# Patient Record
Sex: Male | Born: 1935 | Race: Black or African American | Hispanic: No | Marital: Married | Smoking: Former smoker
Health system: Southern US, Community
[De-identification: ages and names within clinical notes are randomized; demographics above are authoritative.]

## PROBLEM LIST (undated history)

## (undated) DIAGNOSIS — Z860100 Personal history of colon polyps, unspecified: Secondary | ICD-10-CM

## (undated) DIAGNOSIS — E785 Hyperlipidemia, unspecified: Secondary | ICD-10-CM

## (undated) DIAGNOSIS — N529 Male erectile dysfunction, unspecified: Secondary | ICD-10-CM

## (undated) DIAGNOSIS — J189 Pneumonia, unspecified organism: Secondary | ICD-10-CM

## (undated) DIAGNOSIS — R079 Chest pain, unspecified: Secondary | ICD-10-CM

## (undated) DIAGNOSIS — N400 Enlarged prostate without lower urinary tract symptoms: Secondary | ICD-10-CM

## (undated) DIAGNOSIS — K719 Toxic liver disease, unspecified: Secondary | ICD-10-CM

## (undated) DIAGNOSIS — I82419 Acute embolism and thrombosis of unspecified femoral vein: Secondary | ICD-10-CM

## (undated) DIAGNOSIS — M1990 Unspecified osteoarthritis, unspecified site: Secondary | ICD-10-CM

## (undated) DIAGNOSIS — Z950 Presence of cardiac pacemaker: Secondary | ICD-10-CM

## (undated) DIAGNOSIS — I442 Atrioventricular block, complete: Secondary | ICD-10-CM

## (undated) DIAGNOSIS — I10 Essential (primary) hypertension: Secondary | ICD-10-CM

## (undated) HISTORY — DX: Presence of cardiac pacemaker: Z95.0

## (undated) HISTORY — DX: Benign prostatic hyperplasia without lower urinary tract symptoms: N40.0

## (undated) HISTORY — DX: Acute embolism and thrombosis of unspecified femoral vein: I82.419

## (undated) HISTORY — DX: Atrioventricular block, complete: I44.2

## (undated) HISTORY — DX: Toxic liver disease, unspecified: K71.9

## (undated) HISTORY — DX: Personal history of colonic polyps: Z86.010

## (undated) HISTORY — DX: Personal history of colon polyps, unspecified: Z86.0100

## (undated) HISTORY — DX: Essential (primary) hypertension: I10

## (undated) HISTORY — DX: Hyperlipidemia, unspecified: E78.5

## (undated) HISTORY — DX: Male erectile dysfunction, unspecified: N52.9

---

## 1999-09-30 ENCOUNTER — Inpatient Hospital Stay (HOSPITAL_COMMUNITY): Admission: EM | Admit: 1999-09-30 | Discharge: 1999-10-05 | Payer: Self-pay | Admitting: Internal Medicine

## 1999-09-30 ENCOUNTER — Encounter: Payer: Self-pay | Admitting: Emergency Medicine

## 1999-10-03 ENCOUNTER — Encounter: Payer: Self-pay | Admitting: Cardiology

## 1999-10-05 ENCOUNTER — Encounter: Payer: Self-pay | Admitting: Internal Medicine

## 1999-10-25 ENCOUNTER — Inpatient Hospital Stay (HOSPITAL_COMMUNITY): Admission: EM | Admit: 1999-10-25 | Discharge: 1999-10-27 | Payer: Self-pay | Admitting: Internal Medicine

## 1999-10-25 ENCOUNTER — Encounter: Payer: Self-pay | Admitting: Internal Medicine

## 2002-11-14 ENCOUNTER — Emergency Department (HOSPITAL_COMMUNITY): Admission: EM | Admit: 2002-11-14 | Discharge: 2002-11-14 | Payer: Self-pay | Admitting: Emergency Medicine

## 2004-01-27 ENCOUNTER — Ambulatory Visit: Payer: Self-pay | Admitting: Internal Medicine

## 2004-02-04 ENCOUNTER — Ambulatory Visit: Payer: Self-pay | Admitting: Internal Medicine

## 2004-03-01 ENCOUNTER — Ambulatory Visit: Payer: Self-pay

## 2004-04-06 ENCOUNTER — Ambulatory Visit: Payer: Self-pay

## 2004-05-15 ENCOUNTER — Ambulatory Visit: Payer: Self-pay | Admitting: Internal Medicine

## 2004-06-20 ENCOUNTER — Ambulatory Visit: Payer: Self-pay | Admitting: Internal Medicine

## 2004-07-24 ENCOUNTER — Ambulatory Visit: Payer: Self-pay | Admitting: Internal Medicine

## 2004-08-29 ENCOUNTER — Ambulatory Visit: Payer: Self-pay | Admitting: Internal Medicine

## 2004-09-29 ENCOUNTER — Emergency Department (HOSPITAL_COMMUNITY): Admission: EM | Admit: 2004-09-29 | Discharge: 2004-09-29 | Payer: Self-pay | Admitting: Emergency Medicine

## 2004-10-05 ENCOUNTER — Ambulatory Visit: Payer: Self-pay

## 2004-10-20 ENCOUNTER — Ambulatory Visit: Payer: Self-pay | Admitting: Internal Medicine

## 2004-10-24 ENCOUNTER — Ambulatory Visit: Payer: Self-pay | Admitting: Internal Medicine

## 2005-04-12 ENCOUNTER — Ambulatory Visit: Payer: Self-pay

## 2005-12-18 ENCOUNTER — Ambulatory Visit: Payer: Self-pay | Admitting: Internal Medicine

## 2005-12-26 ENCOUNTER — Ambulatory Visit: Payer: Self-pay | Admitting: Gastroenterology

## 2006-01-22 ENCOUNTER — Ambulatory Visit: Payer: Self-pay | Admitting: Gastroenterology

## 2006-01-22 ENCOUNTER — Encounter (INDEPENDENT_AMBULATORY_CARE_PROVIDER_SITE_OTHER): Payer: Self-pay | Admitting: Specialist

## 2006-11-26 ENCOUNTER — Ambulatory Visit: Payer: Self-pay | Admitting: Internal Medicine

## 2007-01-08 ENCOUNTER — Encounter: Payer: Self-pay | Admitting: Internal Medicine

## 2007-01-08 ENCOUNTER — Ambulatory Visit: Payer: Self-pay | Admitting: Internal Medicine

## 2007-01-08 DIAGNOSIS — N400 Enlarged prostate without lower urinary tract symptoms: Secondary | ICD-10-CM

## 2007-01-08 DIAGNOSIS — Z860100 Personal history of colon polyps, unspecified: Secondary | ICD-10-CM | POA: Insufficient documentation

## 2007-01-08 DIAGNOSIS — I10 Essential (primary) hypertension: Secondary | ICD-10-CM | POA: Insufficient documentation

## 2007-01-08 DIAGNOSIS — E785 Hyperlipidemia, unspecified: Secondary | ICD-10-CM | POA: Insufficient documentation

## 2007-01-08 DIAGNOSIS — Z8672 Personal history of thrombophlebitis: Secondary | ICD-10-CM

## 2007-01-08 DIAGNOSIS — F528 Other sexual dysfunction not due to a substance or known physiological condition: Secondary | ICD-10-CM

## 2007-01-15 LAB — CONVERTED CEMR LAB
ALT: 11 units/L (ref 0–53)
AST: 21 units/L (ref 0–37)
Albumin: 3.5 g/dL (ref 3.5–5.2)
Alkaline Phosphatase: 54 units/L (ref 39–117)
BUN: 14 mg/dL (ref 6–23)
Basophils Absolute: 0 10*3/uL (ref 0.0–0.1)
Basophils Relative: 0.3 % (ref 0.0–1.0)
Bilirubin Urine: NEGATIVE
Bilirubin, Direct: 0.1 mg/dL (ref 0.0–0.3)
CO2: 30 meq/L (ref 19–32)
Calcium: 9.1 mg/dL (ref 8.4–10.5)
Chloride: 103 meq/L (ref 96–112)
Cholesterol: 218 mg/dL (ref 0–200)
Creatinine, Ser: 1.1 mg/dL (ref 0.4–1.5)
Direct LDL: 156 mg/dL
Eosinophils Absolute: 0 10*3/uL (ref 0.0–0.6)
Eosinophils Relative: 0.5 % (ref 0.0–5.0)
GFR calc Af Amer: 83 mL/min
GFR calc non Af Amer: 69 mL/min
Glucose, Bld: 84 mg/dL (ref 70–99)
HCT: 39.9 % (ref 39.0–52.0)
HDL: 47.8 mg/dL (ref >39.0)
Hemoglobin, Urine: NEGATIVE
Hemoglobin: 13.7 g/dL (ref 13.0–17.0)
Ketones, ur: NEGATIVE mg/dL
Leukocytes, UA: NEGATIVE
Lymphocytes Relative: 24.6 % (ref 12.0–46.0)
MCHC: 34.3 g/dL (ref 30.0–36.0)
MCV: 87.7 fL (ref 78.0–100.0)
Monocytes Absolute: 0.6 10*3/uL (ref 0.2–0.7)
Monocytes Relative: 11.7 % — ABNORMAL HIGH (ref 3.0–11.0)
Neutro Abs: 3.2 10*3/uL (ref 1.4–7.7)
Neutrophils Relative %: 62.9 % (ref 43.0–77.0)
Nitrite: NEGATIVE
PSA: 1.37 ng/mL (ref 0.10–4.00)
Platelets: 188 10*3/uL (ref 150–400)
Potassium: 4 meq/L (ref 3.5–5.1)
RBC: 4.55 M/uL (ref 4.22–5.81)
RDW: 13.4 % (ref 11.5–14.6)
Sodium: 138 meq/L (ref 135–145)
Specific Gravity, Urine: 1.025 (ref 1.000–1.03)
TSH: 0.53 microintl units/mL (ref 0.35–5.50)
Total Bilirubin: 0.7 mg/dL (ref 0.3–1.2)
Total CHOL/HDL Ratio: 4.6
Total Protein, Urine: NEGATIVE mg/dL
Total Protein: 7.2 g/dL (ref 6.0–8.3)
Triglycerides: 94 mg/dL (ref 0–149)
Urine Glucose: NEGATIVE mg/dL
Urobilinogen, UA: 0.2 (ref 0.0–1.0)
VLDL: 19 mg/dL (ref 0–40)
WBC: 5.1 10*3/uL (ref 4.5–10.5)
pH: 5.5 (ref 5.0–8.0)

## 2007-02-18 ENCOUNTER — Telehealth: Payer: Self-pay | Admitting: Internal Medicine

## 2007-02-20 ENCOUNTER — Ambulatory Visit: Payer: Self-pay | Admitting: Internal Medicine

## 2007-02-21 LAB — CONVERTED CEMR LAB
ALT: 9 units/L (ref 0–53)
AST: 21 units/L (ref 0–37)
Albumin: 3.2 g/dL — ABNORMAL LOW (ref 3.5–5.2)
Alkaline Phosphatase: 52 units/L (ref 39–117)
Bilirubin, Direct: 0.1 mg/dL (ref 0.0–0.3)
Cholesterol: 218 mg/dL (ref 0–200)
Direct LDL: 163.6 mg/dL
HDL: 40.3 mg/dL (ref >39.0)
Total Bilirubin: 0.5 mg/dL (ref 0.3–1.2)
Total CHOL/HDL Ratio: 5.4
Total Protein: 7 g/dL (ref 6.0–8.3)
Triglycerides: 125 mg/dL (ref 0–149)
VLDL: 25 mg/dL (ref 0–40)

## 2007-02-26 ENCOUNTER — Ambulatory Visit: Payer: Self-pay | Admitting: Internal Medicine

## 2007-04-09 ENCOUNTER — Ambulatory Visit: Payer: Self-pay | Admitting: Internal Medicine

## 2007-07-15 ENCOUNTER — Ambulatory Visit: Payer: Self-pay | Admitting: Internal Medicine

## 2007-08-13 ENCOUNTER — Ambulatory Visit: Payer: Self-pay | Admitting: Internal Medicine

## 2007-09-25 ENCOUNTER — Ambulatory Visit: Payer: Self-pay | Admitting: Cardiovascular Disease

## 2007-10-14 ENCOUNTER — Ambulatory Visit: Payer: Self-pay | Admitting: Internal Medicine

## 2007-10-14 LAB — CONVERTED CEMR LAB
BUN: 12 mg/dL (ref 6–23)
Basophils Absolute: 0 10*3/uL (ref 0.0–0.1)
Basophils Relative: 0.5 % (ref 0.0–3.0)
CO2: 28 meq/L (ref 19–32)
Calcium: 8.9 mg/dL (ref 8.4–10.5)
Chloride: 101 meq/L (ref 96–112)
Creatinine, Ser: 1.1 mg/dL (ref 0.4–1.5)
Eosinophils Absolute: 0 10*3/uL (ref 0.0–0.7)
Eosinophils Relative: 0.4 % (ref 0.0–5.0)
GFR calc Af Amer: 83 mL/min
GFR calc non Af Amer: 68 mL/min
Glucose, Bld: 69 mg/dL — ABNORMAL LOW (ref 70–99)
HCT: 38.1 % — ABNORMAL LOW (ref 39.0–52.0)
Hemoglobin: 13.1 g/dL (ref 13.0–17.0)
INR: 1.2 — ABNORMAL HIGH (ref 0.8–1.0)
Lymphocytes Relative: 27.6 % (ref 12.0–46.0)
MCHC: 34.5 g/dL (ref 30.0–36.0)
MCV: 86 fL (ref 78.0–100.0)
Monocytes Absolute: 0.7 10*3/uL (ref 0.1–1.0)
Monocytes Relative: 13.5 % — ABNORMAL HIGH (ref 3.0–12.0)
Neutro Abs: 2.9 10*3/uL (ref 1.4–7.7)
Neutrophils Relative %: 58 % (ref 43.0–77.0)
Platelets: 197 10*3/uL (ref 150–400)
Potassium: 3.6 meq/L (ref 3.5–5.1)
Prothrombin Time: 13.6 s — ABNORMAL HIGH (ref 10.9–13.3)
RBC: 4.43 M/uL (ref 4.22–5.81)
RDW: 14.3 % (ref 11.5–14.6)
Sodium: 136 meq/L (ref 135–145)
WBC: 5 10*3/uL (ref 4.5–10.5)
aPTT: 27.4 s (ref 21.7–29.8)

## 2007-10-21 ENCOUNTER — Ambulatory Visit: Payer: Self-pay | Admitting: Internal Medicine

## 2007-10-21 ENCOUNTER — Ambulatory Visit (HOSPITAL_COMMUNITY): Admission: RE | Admit: 2007-10-21 | Discharge: 2007-10-21 | Payer: Self-pay | Admitting: Internal Medicine

## 2007-11-06 ENCOUNTER — Ambulatory Visit: Payer: Self-pay

## 2008-03-26 ENCOUNTER — Ambulatory Visit: Payer: Self-pay | Admitting: Internal Medicine

## 2008-04-06 ENCOUNTER — Ambulatory Visit: Payer: Self-pay | Admitting: Internal Medicine

## 2008-04-06 DIAGNOSIS — Z950 Presence of cardiac pacemaker: Secondary | ICD-10-CM

## 2008-04-06 HISTORY — PX: INSERT / REPLACE / REMOVE PACEMAKER: SUR710

## 2008-04-09 ENCOUNTER — Encounter: Payer: Self-pay | Admitting: Internal Medicine

## 2008-05-06 ENCOUNTER — Ambulatory Visit: Payer: Self-pay | Admitting: Internal Medicine

## 2008-05-06 LAB — CONVERTED CEMR LAB
AST: 23 units/L (ref 0–37)
Alkaline Phosphatase: 52 units/L (ref 39–117)
Bilirubin, Direct: 0.1 mg/dL (ref 0.0–0.3)
LDL Cholesterol: 109 mg/dL — ABNORMAL HIGH (ref 0–99)
Total CHOL/HDL Ratio: 3.3

## 2008-09-24 ENCOUNTER — Ambulatory Visit: Payer: Self-pay | Admitting: Internal Medicine

## 2008-11-09 ENCOUNTER — Ambulatory Visit: Payer: Self-pay | Admitting: Internal Medicine

## 2009-01-05 ENCOUNTER — Ambulatory Visit: Payer: Self-pay | Admitting: Internal Medicine

## 2009-02-17 ENCOUNTER — Telehealth (INDEPENDENT_AMBULATORY_CARE_PROVIDER_SITE_OTHER): Payer: Self-pay | Admitting: *Deleted

## 2009-02-17 ENCOUNTER — Encounter: Payer: Self-pay | Admitting: Internal Medicine

## 2009-03-01 ENCOUNTER — Ambulatory Visit: Payer: Self-pay | Admitting: Internal Medicine

## 2009-03-14 ENCOUNTER — Telehealth (INDEPENDENT_AMBULATORY_CARE_PROVIDER_SITE_OTHER): Payer: Self-pay | Admitting: *Deleted

## 2009-03-15 ENCOUNTER — Encounter: Payer: Self-pay | Admitting: Internal Medicine

## 2009-03-30 ENCOUNTER — Ambulatory Visit: Payer: Self-pay | Admitting: Internal Medicine

## 2009-03-30 LAB — CONVERTED CEMR LAB
Basophils Absolute: 0 10*3/uL (ref 0.0–0.1)
Bilirubin, Direct: 0.1 mg/dL (ref 0.0–0.3)
CO2: 28 meq/L (ref 19–32)
Calcium: 8.9 mg/dL (ref 8.4–10.5)
Cholesterol: 153 mg/dL (ref 0–200)
Creatinine, Ser: 1.1 mg/dL (ref 0.4–1.5)
Eosinophils Absolute: 0 10*3/uL (ref 0.0–0.7)
GFR calc non Af Amer: 82.52 mL/min (ref >60)
HCT: 38.5 % — ABNORMAL LOW (ref 39.0–52.0)
Hemoglobin, Urine: NEGATIVE
Hemoglobin: 12.6 g/dL — ABNORMAL LOW (ref 13.0–17.0)
LDL Cholesterol: 90 mg/dL (ref 0–99)
Lymphs Abs: 1.3 10*3/uL (ref 0.7–4.0)
MCHC: 32.6 g/dL (ref 30.0–36.0)
MCV: 90.7 fL (ref 78.0–100.0)
Monocytes Absolute: 0.7 10*3/uL (ref 0.1–1.0)
Neutro Abs: 2.9 10*3/uL (ref 1.4–7.7)
RDW: 13.6 % (ref 11.5–14.6)
Total Bilirubin: 0.8 mg/dL (ref 0.3–1.2)
Total Protein, Urine: NEGATIVE mg/dL
Total Protein: 7 g/dL (ref 6.0–8.3)
Triglycerides: 48 mg/dL (ref 0.0–149.0)
Urine Glucose: NEGATIVE mg/dL

## 2009-03-31 ENCOUNTER — Ambulatory Visit: Payer: Self-pay | Admitting: Internal Medicine

## 2009-04-06 ENCOUNTER — Telehealth: Payer: Self-pay | Admitting: Internal Medicine

## 2009-06-01 ENCOUNTER — Ambulatory Visit: Payer: Self-pay | Admitting: Internal Medicine

## 2009-06-20 ENCOUNTER — Encounter: Payer: Self-pay | Admitting: Internal Medicine

## 2009-09-07 ENCOUNTER — Ambulatory Visit: Payer: Self-pay | Admitting: Internal Medicine

## 2009-09-21 ENCOUNTER — Encounter: Payer: Self-pay | Admitting: Internal Medicine

## 2009-10-19 ENCOUNTER — Ambulatory Visit: Payer: Self-pay | Admitting: Internal Medicine

## 2009-10-19 DIAGNOSIS — D179 Benign lipomatous neoplasm, unspecified: Secondary | ICD-10-CM | POA: Insufficient documentation

## 2009-10-19 LAB — CONVERTED CEMR LAB
BUN: 13 mg/dL (ref 6–23)
Calcium: 8.9 mg/dL (ref 8.4–10.5)
Cholesterol: 164 mg/dL (ref 0–200)
Creatinine, Ser: 1 mg/dL (ref 0.4–1.5)
GFR calc non Af Amer: 94.15 mL/min (ref >60)
LDL Cholesterol: 99 mg/dL (ref 0–99)
Potassium: 4 meq/L (ref 3.5–5.1)
Total Bilirubin: 0.6 mg/dL (ref 0.3–1.2)
Triglycerides: 71 mg/dL (ref 0.0–149.0)
VLDL: 14.2 mg/dL (ref 0.0–40.0)

## 2009-12-13 ENCOUNTER — Ambulatory Visit: Payer: Self-pay | Admitting: Internal Medicine

## 2009-12-13 DIAGNOSIS — R5381 Other malaise: Secondary | ICD-10-CM | POA: Insufficient documentation

## 2009-12-13 DIAGNOSIS — R5383 Other fatigue: Secondary | ICD-10-CM

## 2010-01-06 ENCOUNTER — Encounter (INDEPENDENT_AMBULATORY_CARE_PROVIDER_SITE_OTHER): Payer: Self-pay | Admitting: *Deleted

## 2010-03-16 ENCOUNTER — Ambulatory Visit
Admission: RE | Admit: 2010-03-16 | Discharge: 2010-03-16 | Payer: Self-pay | Source: Home / Self Care | Attending: Internal Medicine | Admitting: Internal Medicine

## 2010-03-17 ENCOUNTER — Encounter: Payer: Self-pay | Admitting: Internal Medicine

## 2010-03-24 ENCOUNTER — Encounter: Payer: Self-pay | Admitting: Internal Medicine

## 2010-04-09 LAB — CONVERTED CEMR LAB
ALT: 10 units/L (ref 0–53)
AST: 25 units/L (ref 0–37)
Albumin: 3.7 g/dL (ref 3.5–5.2)
Alkaline Phosphatase: 53 units/L (ref 39–117)
BUN: 11 mg/dL (ref 6–23)
Basophils Absolute: 0 10*3/uL (ref 0.0–0.1)
Basophils Relative: 1 % (ref 0.0–3.0)
Bilirubin Urine: NEGATIVE
Bilirubin, Direct: 0.1 mg/dL (ref 0.0–0.3)
CO2: 30 meq/L (ref 19–32)
Calcium: 9.3 mg/dL (ref 8.4–10.5)
Chloride: 101 meq/L (ref 96–112)
Cholesterol: 235 mg/dL (ref 0–200)
Creatinine, Ser: 1.1 mg/dL (ref 0.4–1.5)
Direct LDL: 174.8 mg/dL
Eosinophils Absolute: 0 10*3/uL (ref 0.0–0.7)
Eosinophils Relative: 0.5 % (ref 0.0–5.0)
GFR calc Af Amer: 83 mL/min
GFR calc non Af Amer: 68 mL/min
Glucose, Bld: 75 mg/dL (ref 70–99)
HCT: 38.1 % — ABNORMAL LOW (ref 39.0–52.0)
HDL: 51.3 mg/dL (ref >39.0)
Hemoglobin, Urine: NEGATIVE
Hemoglobin: 13.7 g/dL (ref 13.0–17.0)
Ketones, ur: NEGATIVE mg/dL
Leukocytes, UA: NEGATIVE
Lymphocytes Relative: 25.2 % (ref 12.0–46.0)
MCHC: 35.9 g/dL (ref 30.0–36.0)
MCV: 84.6 fL (ref 78.0–100.0)
Monocytes Absolute: 0.6 10*3/uL (ref 0.1–1.0)
Monocytes Relative: 14.2 % — ABNORMAL HIGH (ref 3.0–12.0)
Neutro Abs: 2.6 10*3/uL (ref 1.4–7.7)
Neutrophils Relative %: 59.1 % (ref 43.0–77.0)
Nitrite: NEGATIVE
PSA: 1.69 ng/mL (ref 0.10–4.00)
Platelets: 167 10*3/uL (ref 150–400)
Potassium: 4.4 meq/L (ref 3.5–5.1)
RBC: 4.5 M/uL (ref 4.22–5.81)
RDW: 13.6 % (ref 11.5–14.6)
Sodium: 137 meq/L (ref 135–145)
Specific Gravity, Urine: <=1.005 (ref 1.000–1.03)
TSH: 0.78 microintl units/mL (ref 0.35–5.50)
Total Bilirubin: 0.9 mg/dL (ref 0.3–1.2)
Total CHOL/HDL Ratio: 4.6
Total Protein, Urine: NEGATIVE mg/dL
Total Protein: 7.7 g/dL (ref 6.0–8.3)
Triglycerides: 81 mg/dL (ref 0–149)
Urine Glucose: NEGATIVE mg/dL
Urobilinogen, UA: 0.2 (ref 0.0–1.0)
VLDL: 16 mg/dL (ref 0–40)
WBC: 4.3 10*3/uL — ABNORMAL LOW (ref 4.5–10.5)
pH: 6 (ref 5.0–8.0)

## 2010-04-11 NOTE — Assessment & Plan Note (Signed)
Summary: 6 mos f/u appt per pt/cd   Vital Signs:  Patient profile:   77 year old male Height:      70 inches Weight:      210.50 pounds BMI:     30.31 O2 Sat:      98 % on Room air Temp:     97.1 degrees F oral BP sitting:   124 / 68  (left arm) Cuff size:   large  Vitals Entered By: Zella Ball Ewing CMA Duncan Dull) (October 19, 2009 3:55 PM)  O2 Flow:  Room air  CC: 6 month ROV/RE   CC:  6 month ROV/RE.  History of Present Illness:  not taking the amlodipine - not clear why;   overall doing well, Pt denies CP, sob, doe, wheezing, orthopnea, pnd, worsening LE edema, palps, dizziness or syncope  Pt denies new neuro symptoms such as headache, facial or extremity weakness  No fever, wt loss, night sweats, loss of appetite or other constitutional symptoms  Quite active, no worsening memory problems, drove last year to [place] for [employer] reunion and back without difficulty.  Does have a lump to the right scapular area he is concerned about but does not hurt and has not change in size since last visit   Problems Prior to Update: 1)  Hepatotoxicity, Drug-induced, Risk of  (ICD-V58.69) 2)  Lipoma  (ICD-214.9) 3)  Atrial Fibrillation-paroxysma  (ICD-427.31) 4)  Pacemaker  (ICD-V45.Marland Kitchen01) 5)  Preventive Health Care  (ICD-V70.0) 6)  Deep Venous Thrombophlebitis, Hx of  (ICD-V12.52) 7)  Colonic Polyps, Hx of  (ICD-V12.72) 8)  Hyperlipidemia  (ICD-272.4) 9)  Atrioventricular Block,intermittent  (ICD-426.0) 10)  Erectile Dysfunction  (ICD-302.72) 11)  Benign Prostatic Hypertrophy  (ICD-600.00) 12)  Hypertension  (ICD-401.9)  Medications Prior to Update: 1)  Lisinopril-Hydrochlorothiazide 20-25 Mg Tabs (Lisinopril-Hydrochlorothiazide) .Marland Kitchen.. 1 By Mouth Once Daily 2)  Istalol 0.5 %  Soln (Timolol Maleate) .Marland Kitchen.. 1 Gtt Ou Qd 3)  Ecotrin Low Strength 81 Mg  Tbec (Aspirin) .Marland Kitchen.. 1 By Mouth Qd 4)  Simvastatin 40 Mg Tabs (Simvastatin) .Marland Kitchen.. 1po Once Daily 5)  Amlodipine Besylate 5 Mg Tabs (Amlodipine  Besylate) .Marland Kitchen.. 1po Once Daily  Current Medications (verified): 1)  Lisinopril-Hydrochlorothiazide 20-25 Mg Tabs (Lisinopril-Hydrochlorothiazide) .Marland Kitchen.. 1 By Mouth Once Daily 2)  Istalol 0.5 %  Soln (Timolol Maleate) .Marland Kitchen.. 1 Gtt Ou Qd 3)  Ecotrin Low Strength 81 Mg  Tbec (Aspirin) .Marland Kitchen.. 1 By Mouth Qd 4)  Simvastatin 40 Mg Tabs (Simvastatin) .Marland Kitchen.. 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   Head:  normocephalic and atraumatic.   Eyes:  vision grossly intact, pupils equal, and pupils round.   Ears:  R ear normal and L ear normal.   Nose:  no external deformity and no nasal discharge.   Mouth:  no gingival abnormalities and pharynx pink and moist.   Neck:  supple and no masses.   Lungs:  normal respiratory effort and normal breath sounds.   Heart:  normal rate and regular rhythm.   Extremities:  no edema, no erythema  Skin:  2 cm prob subq lipoma right upper back, nontender nonfluctuance, nontender   Impression & Recommendations:  Problem # 1:  HYPERTENSION (ICD-401.9)  The following medications were removed from the medication list:    Amlodipine Besylate 5 Mg Tabs (Amlodipine besylate) .Marland Kitchen... 1po once daily His updated medication list for this problem includes:     Lisinopril-hydrochlorothiazide 20-25 Mg Tabs (Lisinopril-hydrochlorothiazide) .Marland Kitchen... 1 by mouth once daily  Orders: TLB-BMP (Basic Metabolic Panel-BMET) (80048-METABOL)  BP today: 124/68 Prior BP: 120/60 (03/31/2009)  Labs Reviewed: K+: 4.0 (03/30/2009) Creat: : 1.1 (03/30/2009)   Chol: 153 (03/30/2009)   HDL: 53.70 (03/30/2009)   LDL: 90 (03/30/2009)   TG: 48.0 (03/30/2009) stable overall by hx and exam, ok to continue meds/tx as is   Problem # 2:  HYPERLIPIDEMIA (ICD-272.4)  His updated medication list for this problem includes:    Simvastatin 40 Mg Tabs (Simvastatin) .Marland Kitchen... 1po once daily  Orders: TLB-Lipid Panel (80061-LIPID)  Labs Reviewed: SGOT: 23 (03/30/2009)   SGPT: 8 (03/30/2009)   HDL:53.70 (03/30/2009), 50.4 (05/06/2008)  LDL:90 (03/30/2009), 109 (04/54/0981)  Chol:153 (03/30/2009), 168 (05/06/2008)  Trig:48.0 (03/30/2009), 42 (05/06/2008) stable overall by hx and exam, ok to continue meds/tx as is    Problem # 3:  LIPOMA (ICD-214.9) midl to mod size, right scapular area - d/w pt - no need tx at this time, to follow for any incr size  Complete Medication List: 1)  Lisinopril-hydrochlorothiazide 20-25 Mg Tabs (Lisinopril-hydrochlorothiazide) .Marland Kitchen.. 1 by mouth once daily 2)  Istalol 0.5 % Soln (Timolol maleate) .Marland Kitchen.. 1 gtt ou qd 3)  Ecotrin Low Strength 81 Mg Tbec (Aspirin) .Marland Kitchen.. 1 by mouth qd 4)  Simvastatin 40 Mg Tabs (Simvastatin) .Marland Kitchen.. 1po once daily  Other Orders: TLB-Hepatic/Liver Function Pnl (80076-HEPATIC)  Patient Instructions: 1)  Continue all previous medications as before this visit  2)  Please go to the Lab in the basement for your blood and/or urine tests today 3)  Please call the number on the [facility] for results of your testing 4)  Please schedule a follow-up appointment in 6 months with CPX labs

## 2010-04-11 NOTE — Letter (Signed)
Summary: Remote Device Check  [facility], Main Office  [address]   [place], [place] [address]   Phone: [phone]  Fax: [phone]     March 15, 2009 MRN: [id number]   [name] [address] [place], [place]  [address]   Dear [name],   Your remote transmission was recieved and reviewed by your physician.  All diagnostics were within normal limits for you.  __X___Your next transmission is scheduled for:   June 01, 2009.  Please transmit at any time this day.  If you have a wireless device your transmission will be sent automatically.      Sincerely,  Proofreader

## 2010-04-11 NOTE — Letter (Signed)
Summary: Remote Device Check  [facility], Main Office  [address]   [place], [place] [address]   Phone: [phone]  Fax: [phone]     September 21, 2009 MRN: [id number]   [name] [address] [place], [place]  [address]   Dear [name],   Your remote transmission was recieved and reviewed by your physician.  All diagnostics were within normal limits for you.  __X___Your next transmission is scheduled for:  12-08-2009.  Please transmit at any time this day.  If you have a wireless device your transmission will be sent automatically.    Sincerely,  Vella Kohler

## 2010-04-11 NOTE — Assessment & Plan Note (Signed)
Summary: pacer check.[email or web address]      Allergies Added: NKDA  Visit Type:  pacer check   History of Present Illness: [name] is seen in follow up her pacemaker implanted for high-grade heart block with subsequent generator replacement in August 2009. He is doing relatively well without complaints of chest pain. He does have some mild increases in his shortness of breath. He has had no palpitations.  He does complain of some fatigue. He has daytime somnolence and has on restful sleep.  Current Medications (verified): 1)  Lisinopril-Hydrochlorothiazide 20-25 Mg Tabs (Lisinopril-Hydrochlorothiazide) .Marland Kitchen.. 1 By Mouth Once Daily 2)  Istalol 0.5 %  Soln (Timolol Maleate) .Marland Kitchen.. 1 Gtt Ou Qd 3)  Ecotrin Low Strength 81 Mg  Tbec (Aspirin) .Marland Kitchen.. 1 By Mouth Qd 4)  Simvastatin 40 Mg Tabs (Simvastatin) .Marland Kitchen.. 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 HEENT Normal.  Neck veins were flat,  carotids were brisk.  Lungs were clear.  Heart sounds were regular without murmurs or gallops.  Abdomen was soft with active bowel sounds. There is no clubbing cyanosis or edema. Skin Warm and dry ]   PPM Specifications Following MD:  Sherryl Manges, MD     PPM Vendor:  Medtronic     PPM Model Number:  [id number]     PPM Serial Number:  [id number] PPM DOI:  10/21/2007     PPM Implanting MD:  Sherryl Manges, MD  Lead 1    Location: RA     DOI: 10/04/1999     Model #: 1342T     Serial #: [id number]     Status: active Lead 2    Location: RV     DOI: 10/04/1999     Model #: [id number]     Serial #: [id number]     Status: active  Magnet Response Rate:  BOL 85 ERI 65  Indications:  SYNCOPE   PPM Follow Up Battery Voltage:  2.79 V     Battery Est. Longevity:  10 yrs     Pacer Dependent:  No       PPM Device Measurements Atrium  Amplitude: 5.60 mV, Impedance: 466 ohms, Threshold: 0.50 V at 0.40 msec Right Ventricle  Amplitude: 8.00 mV, Impedance: 1064 ohms, Threshold: 0.50 V at 0.40 msec  Episodes MS Episodes:  80     Percent Mode Switch:  <0.1%     Coumadin:  No Ventricular High Rate:  0  Atrial Pacing:  11.3%     Ventricular Pacing:  16%  Parameters Mode:  DDDR     Lower Rate Limit:  60     Upper Rate Limit:  130 Paced AV Delay:  350     Sensed AV Delay:  350 Next Remote Date:  03/16/2010     Next Cardiology Appt Due:  12/11/2010 Tech Comments:  80 MODE SWITCHES--LONGEST WAS 1 HR 18 MINUTES. - COUMADIN.  NORMAL DEVICE FUNCTION. CHANGED RA OUTPUT FROM 1.50 TO .200 AND RV OUTPUT FROM 4.00 TO 2.50 V.  CARELINK TRANSMISSION 03-16-10 AND ROV IN 12 MTHS W/SK. Vella Kohler  December 13, 2009 3:24 PM  Impression & Recommendations:  Problem # 1:  ATRIOVENTRICULAR BLOCK,INTERMITTENT (ICD-426.0) stable post pacemaker implant His updated medication list for this problem includes:    Lisinopril-hydrochlorothiazide 20-25 Mg Tabs (Lisinopril-hydrochlorothiazide) .Marland Kitchen... 1 by mouth once daily    Ecotrin Low  Strength 81 Mg Tbec (Aspirin) .Marland Kitchen... 1 by mouth qd  Problem # 2:  PACEMAKER (ICD-V45.Marland Kitchen01) Device parameters and data were reviewed and no changes were made  Problem # 3:  FATIGUE (ICD-780.79) I wonder whether the patient doesn't have some obstructive sleep apnea. He certainly has the habitus and symptoms. I've asked her to discuss this with his wife. He is to followup with Dr. Jonny Ruiz in the next couple of months.

## 2010-04-11 NOTE — Assessment & Plan Note (Signed)
Summary: 6 MO ROV /NWS $50   Vital Signs:  Patient profile:   77 year old male Height:      66 inches Weight:      214 pounds BMI:     34.67 O2 Sat:      98 % on Room air Temp:     97.7 degrees F oral Pulse rate:   74 / minute BP sitting:   120 / 60  (left arm) Cuff size:   large  Vitals Entered By: Zella Ball Ewing (March 31, 2009 3:21 PM)  O2 Flow:  Room air  CC: 6 Mo ROV/RE   CC:  6 Mo ROV/RE.  History of Present Illness: overall doing well, no complaints except does not like the diuretic effect of the lis hct though he specifically denies BPH symptoms such as hesitancy, slow stream, or retention.  Pt denies CP, sob, doe, wheezing, orthopnea, pnd, worsening LE edema, palps, dizziness or syncope  Pt denies new neuro symptoms such as headache, facial or extremity weakness     Problems Prior to Update: 1)  Atrial Fibrillation-paroxysma  (ICD-427.31) 2)  Pacemaker  (ICD-V45.Marland Kitchen01) 3)  Preventive Health Care  (ICD-V70.0) 4)  Deep Venous Thrombophlebitis, Hx of  (ICD-V12.52) 5)  Colonic Polyps, Hx of  (ICD-V12.72) 6)  Hyperlipidemia  (ICD-272.4) 7)  Atrioventricular Block,intermittent  (ICD-426.0) 8)  Erectile Dysfunction  (ICD-302.72) 9)  Benign Prostatic Hypertrophy  (ICD-600.00) 10)  Hypertension  (ICD-401.9)  Medications Prior to Update: 1)  Lisinopril-Hydrochlorothiazide 20-25 Mg  Tabs (Lisinopril-Hydrochlorothiazide) .Marland Kitchen.. 1 By Mouth Once Daily 2)  Istalol 0.5 %  Soln (Timolol Maleate) .Marland Kitchen.. 1 Gtt Ou Qd 3)  Ecotrin Low Strength 81 Mg  Tbec (Aspirin) .Marland Kitchen.. 1 By Mouth Qd 4)  Simvastatin 40 Mg Tabs (Simvastatin) .Marland Kitchen.. 1po Once Daily  Current Medications (verified): 1)  Lisinopril 20 Mg Tabs (Lisinopril) .Marland Kitchen.. 1po Once Daily 2)  Istalol 0.5 %  Soln (Timolol Maleate) .Marland Kitchen.. 1 Gtt Ou Qd 3)  Ecotrin Low Strength 81 Mg  Tbec (Aspirin) .Marland Kitchen.. 1 By Mouth Qd 4)  Simvastatin 40 Mg Tabs (Simvastatin) .Marland Kitchen.. 1po Once Daily 5)  Amlodipine Besylate 5 Mg Tabs (Amlodipine Besylate) .Marland Kitchen.. 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         all otherwise negative per pt - 12 system review done   Physical Exam  General:  alert and well-developed.  Head:  normocephalic and atraumatic.   Eyes:  vision grossly intact, pupils equal, and pupils round.   Ears:  R ear normal and L ear normal.   Nose:  no external deformity and no nasal discharge.   Mouth:  no gingival abnormalities and pharynx pink and moist.   Neck:  supple and no masses.   Lungs:  normal respiratory effort and normal breath sounds.   Heart:   normal rate and regular rhythm.   Abdomen:  soft, non-tender, and normal bowel sounds.   Msk:  no joint tenderness and no joint swelling.   Extremities:  no edema, no erythema  Neurologic:  cranial nerves II-XII intact and strength normal in all extremities.     Impression & Recommendations:  Problem # 1:  Preventive Health Care (ICD-V70.0) Overall doing well, updated the age appropriate counseling and education;  routine health screening/prevention reviewed and done as appropriate unless declined, immunizations up to date or declined, diet counseling done if overweight, urged to quit smoking if smokes , most recent labs reviewed and current ordered if appropriate, ecg reviewed or declined; information regarding Medicare Prevention requirements given if appropriate   Problem # 2:  HYPERTENSION (ICD-401.9)  His updated medication list for this problem includes:    Lisinopril 20 Mg Tabs (Lisinopril) .Marland Kitchen... 1po once daily    Amlodipine Besylate 5 Mg Tabs (Amlodipine besylate) .Marland Kitchen... 1po once daily adjust meds above to avoid diuritec effect per pt request  Complete Medication List: 1)  Lisinopril 20 Mg Tabs (Lisinopril) .Marland Kitchen.. 1po once daily 2)  Istalol 0.5 % Soln (Timolol maleate) .Marland Kitchen.. 1 gtt ou qd 3)  Ecotrin Low Strength 81 Mg Tbec (Aspirin) .Marland Kitchen.. 1 by mouth qd 4)  Simvastatin 40 Mg Tabs (Simvastatin) .Marland Kitchen.. 1po once daily 5)  Amlodipine Besylate 5 Mg Tabs (Amlodipine besylate) .Marland Kitchen.. 1po once daily  Patient Instructions: 1)  stop the lisinopril HCT 2)  start the lisinopril 20 mg per day 3)  start the amlodipine 5 mg per day 4)  Continue all previous medications as before this visit  5)  Please schedule a follow-up appointment in 6 months. Prescriptions: SIMVASTATIN 40 MG TABS (SIMVASTATIN) 1po once daily  #90 x 3   Entered and Authorized by:   Corwin Levins MD   Signed by:   Corwin Levins MD on 03/31/2009   Method used:   Electronically to        [facility] (retail)       [address]       [place], [place]  [address]       Ph: [phone]       Fax: [phone]   RxID:   [id number] AMLODIPINE BESYLATE 5 MG TABS (AMLODIPINE BESYLATE) 1po once daily  #90 x 3   Entered and Authorized by:   Corwin Levins MD   Signed by:   Corwin Levins MD on 03/31/2009   Method used:   Electronically to        [facility] (retail)       [address]       [place], [place]  [address]       Ph: [phone]       Fax: [phone]   RxID:   [id number] LISINOPRIL 20 MG TABS (LISINOPRIL) 1po once daily  #90 x 3   Entered and Authorized by:  Corwin Levins MD   Signed by:   Corwin Levins MD on 03/31/2009   Method used:   Electronically to        [facility] (retail)       [address]       [place], [place]  [address]       Ph: [phone]       Fax: [phone]   RxID:   [id number]

## 2010-04-11 NOTE — Letter (Signed)
Summary: Remote Device Check  [facility], Main Office  [address]   [place], [place] [address]   Phone: [phone]  Fax: [phone]     September 21, 2009 MRN: [id number]   RIELLY CORLETT [address] [place], [place]  [address]   Dear Mr. SIDDIQI,   Your remote transmission was recieved and reviewed by your physician.  All diagnostics were within normal limits for you.  __X____Your next office visit is scheduled for:  September with Dr Graciela Husbands. Please call our office to schedule an appointment.    Sincerely,  Vella Kohler

## 2010-04-11 NOTE — Cardiovascular Report (Signed)
Summary: Office Visit Remote   Office Visit Remote   Imported By: Roderic Ovens 03/17/2009 12:40:53  _____________________________________________________________________  External Attachment:    Type:   Image     Comment:   External Document

## 2010-04-11 NOTE — Cardiovascular Report (Signed)
Summary: Office Visit Remote  Office Visit Remote   Imported By: Roderic Ovens 06/21/2009 12:39:20  _____________________________________________________________________  External Attachment:    Type:   Image     Comment:   External Document

## 2010-04-11 NOTE — Cardiovascular Report (Signed)
Summary: Office Visit Remote   Office Visit Remote   Imported By: Roderic Ovens 09/22/2009 16:41:56  _____________________________________________________________________  External Attachment:    Type:   Image     Comment:   External Document

## 2010-04-11 NOTE — Progress Notes (Signed)
Summary: Rx change request  Phone Note Call from Patient Call back at Home Phone [phone]   Caller: Patient Call For: Corwin Levins MD Reason for Call: Talk to Doctor Summary of Call: Patient came into the office. Dr. Jonny Ruiz recently put him on Lisinopril and Amlodipine, and when he went to the pharmacy, they told him about the side effects and he does not want to take these medications. He wants to take different medications. He wants to take the ones he was taking before. Initial call taken by: Irma Newness,  April 06, 2009 12:55 PM  Follow-up for Phone Call        pt is waiting in the lobby, I advised pt that this will take MD time and he is seeing pt. pt insists on waiting until Rxs are changed per Verlon Au. please advise Follow-up by: Margaret Pyle, CMA,  April 06, 2009 1:06 PM  Additional Follow-up for Phone Call Additional follow up Details #1::        ok to go back to lis-hct - done hardcopy to LIM side B - dahlia  Additional Follow-up by: Corwin Levins MD,  April 06, 2009 1:20 PM    Additional Follow-up for Phone Call Additional follow up Details #2::    rx given to [facility] to give to pt in Lobby Follow-up by: Margaret Pyle, CMA,  April 06, 2009 1:31 PM  New/Updated Medications: LISINOPRIL-HYDROCHLOROTHIAZIDE 20-25 MG TABS (LISINOPRIL-HYDROCHLOROTHIAZIDE) 1 by mouth once daily Prescriptions: LISINOPRIL-HYDROCHLOROTHIAZIDE 20-25 MG TABS (LISINOPRIL-HYDROCHLOROTHIAZIDE) 1 by mouth once daily  #90 x 3   Entered and Authorized by:   Corwin Levins MD   Signed by:   Corwin Levins MD on 04/06/2009   Method used:   Print then Give to Patient   RxID:   [id number]

## 2010-04-11 NOTE — Progress Notes (Signed)
Summary: [facility]  Phone Note Other Incoming   Summary of Call: Received paperwork from [facility]. Put on MD's desk. Initial call taken by: Josph Macho CMA,  March 14, 2009 11:39 AM     Appended Document: [facility] Faxed completed paperwork and sent a copy to scanning.

## 2010-04-11 NOTE — Letter (Signed)
Summary: Remote Device Check  [facility], Main Office  [address]   [place], [place] [address]   Phone: [phone]  Fax: [phone]     June 20, 2009 MRN: [id number]   [name] [address] [place], [place]  [address]   Dear [name],   Your remote transmission was recieved and reviewed by your physician.  All diagnostics were within normal limits for you.  __X___Your next transmission is scheduled for:  September 07, 2009.  Please transmit at any time this day.  If you have a wireless device your transmission will be sent automatically.     Sincerely,  Proofreader

## 2010-04-11 NOTE — Miscellaneous (Signed)
Summary: dx correction  Clinical Lists Changes  Problems: Changed problem from PACEMAKER (ICD-V45..01) to PACEMAKER, PERMANENT (ICD-V45.01)  changed the incorrect dx code to correct dx code 

## 2010-04-12 ENCOUNTER — Ambulatory Visit: Admit: 2010-04-12 | Payer: Self-pay | Admitting: Internal Medicine

## 2010-04-12 ENCOUNTER — Other Ambulatory Visit: Payer: Self-pay

## 2010-04-13 NOTE — Letter (Signed)
Summary: [facility]  [facility]   Imported By: Lennie Odor 04/05/2010 14:33:03  _____________________________________________________________________  External Attachment:    Type:   Image     Comment:   External Document

## 2010-04-19 ENCOUNTER — Ambulatory Visit (INDEPENDENT_AMBULATORY_CARE_PROVIDER_SITE_OTHER): Payer: Medicare Other | Admitting: Internal Medicine

## 2010-04-19 ENCOUNTER — Encounter: Payer: Self-pay | Admitting: Internal Medicine

## 2010-04-19 ENCOUNTER — Encounter (INDEPENDENT_AMBULATORY_CARE_PROVIDER_SITE_OTHER): Payer: Self-pay | Admitting: *Deleted

## 2010-04-19 ENCOUNTER — Other Ambulatory Visit: Payer: Self-pay | Admitting: Internal Medicine

## 2010-04-19 ENCOUNTER — Other Ambulatory Visit: Payer: Medicare Other

## 2010-04-19 DIAGNOSIS — E785 Hyperlipidemia, unspecified: Secondary | ICD-10-CM

## 2010-04-19 DIAGNOSIS — I10 Essential (primary) hypertension: Secondary | ICD-10-CM

## 2010-04-19 DIAGNOSIS — Z0000 Encounter for general adult medical examination without abnormal findings: Secondary | ICD-10-CM

## 2010-04-19 LAB — CBC WITH DIFFERENTIAL/PLATELET
Basophils Absolute: 0 10*3/uL (ref 0.0–0.1)
Basophils Relative: 0.4 % (ref 0.0–3.0)
Eosinophils Absolute: 0 10*3/uL (ref 0.0–0.7)
HCT: 37 % — ABNORMAL LOW (ref 39.0–52.0)
Hemoglobin: 12.7 g/dL — ABNORMAL LOW (ref 13.0–17.0)
Lymphs Abs: 1.1 10*3/uL (ref 0.7–4.0)
MCHC: 34.2 g/dL (ref 30.0–36.0)
Monocytes Relative: 12 % (ref 3.0–12.0)
Neutro Abs: 3 10*3/uL (ref 1.4–7.7)
RDW: 15 % — ABNORMAL HIGH (ref 11.5–14.6)

## 2010-04-19 LAB — HEPATIC FUNCTION PANEL
AST: 23 U/L (ref 0–37)
Albumin: 3.8 g/dL (ref 3.5–5.2)
Total Protein: 7.3 g/dL (ref 6.0–8.3)

## 2010-04-19 LAB — BASIC METABOLIC PANEL
CO2: 25 mEq/L (ref 19–32)
Glucose, Bld: 82 mg/dL (ref 70–99)
Potassium: 3.8 mEq/L (ref 3.5–5.1)
Sodium: 134 mEq/L — ABNORMAL LOW (ref 135–145)

## 2010-04-19 LAB — URINALYSIS
Ketones, ur: NEGATIVE
Leukocytes, UA: NEGATIVE
Specific Gravity, Urine: 1.01 (ref 1.000–1.030)
Urine Glucose: NEGATIVE
Urobilinogen, UA: 0.2 (ref 0.0–1.0)
pH: 5 (ref 5.0–8.0)

## 2010-04-19 LAB — LIPID PANEL
HDL: 58.4 mg/dL (ref >39.00)
Triglycerides: 62 mg/dL (ref 0.0–149.0)

## 2010-04-19 LAB — TSH: TSH: 0.62 u[IU]/mL (ref 0.35–5.50)

## 2010-04-23 ENCOUNTER — Encounter (INDEPENDENT_AMBULATORY_CARE_PROVIDER_SITE_OTHER): Payer: Self-pay | Admitting: *Deleted

## 2010-04-27 NOTE — Assessment & Plan Note (Signed)
Summary: 6 MTH FU --STC   Vital Signs:  Patient profile:   77 year old male Height:      70 inches Weight:      213.38 pounds BMI:     30.73 O2 Sat:      98 % on Room air Temp:     97.6 degrees F oral Pulse rate:   86 / minute BP sitting:   124 / 68  (left arm) Cuff size:   large  Vitals Entered By: Zella Ball Ewing CMA Duncan Dull) (April 19, 2010 3:17 PM)  O2 Flow:  Room air  Preventive Care Screening  Last Flu Shot:    Date:  12/10/2009    Results:  given   CC: 6 month followup/RE   CC:  6 month followup/RE.  History of Present Illness: here for wellness, overall doing ok;  Pt denies CP, worsening sob, doe, wheezing, orthopnea, pnd, worsening LE edema, palps, dizziness or syncope  Pt denies new neuro symptoms such as headache, facial or extremity weakness  Pt denies polydipsia, polyuria.  Overall good compliance with meds, trying to follow low chol diet, wt stable, little excercise however  Overall good compliance with meds, and good tolerability.  No fever, wt loss, night sweats, loss of appetite or other constitutional symptoms  Denies worsening depressive symptoms, suicidal ideation, or panic.   Pt states good ability with ADL's, low fall risk, home safety reviewed and adequate, no significant change in hearing or vision, trying to follow lower chol diet, and occasionally active only with regular excercise.  No other new complaints.    Preventive Screening-Counseling & Management      Drug Use:  no.    Problems Prior to Update: 1)  Fatigue  (ICD-780.79) 2)  Hepatotoxicity, Drug-induced, Risk of  (ICD-V58.69) 3)  Lipoma  (ICD-214.9) 4)  Pacemaker, Permanent  (ICD-V45.01) 5)  Preventive Health Care  (ICD-V70.0) 6)  Deep Venous Thrombophlebitis, Hx of  (ICD-V12.52) 7)  Colonic Polyps, Hx of  (ICD-V12.72) 8)  Hyperlipidemia  (ICD-272.4) 9)  Atrioventricular Block,intermittent  (ICD-426.0) 10)  Erectile Dysfunction  (ICD-302.72) 11)  Benign Prostatic Hypertrophy   (ICD-600.00) 12)  Hypertension  (ICD-401.9)  Medications Prior to Update: 1)  Lisinopril-Hydrochlorothiazide 20-25 Mg Tabs (Lisinopril-Hydrochlorothiazide) .Marland Kitchen.. 1 By Mouth Once Daily 2)  Istalol 0.5 %  Soln (Timolol Maleate) .Marland Kitchen.. 1 Gtt Ou Qd 3)  Ecotrin Low Strength 81 Mg  Tbec (Aspirin) .Marland Kitchen.. 1 By Mouth Qd 4)  Simvastatin 40 Mg Tabs (Simvastatin) .Marland Kitchen.. 1po Once Daily  Current Medications (verified): 1)  Lisinopril-Hydrochlorothiazide 20-25 Mg Tabs (Lisinopril-Hydrochlorothiazide) .Marland Kitchen.. 1 By Mouth Once Daily 2)  Istalol 0.5 %  Soln (Timolol Maleate) .Marland Kitchen.. 1 Gtt Ou Qd 3)  Ecotrin Low Strength 81 Mg  Tbec (Aspirin) .Marland Kitchen.. 1 By Mouth Qd 4)  Simvastatin 40 Mg Tabs (Simvastatin) .Marland Kitchen.. 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 for above Hyperlipidemia Colonic polyps, hx of - multiple large LUE DVT  Past Surgical History: Last updated: 04/06/2008 s/p pacemaker-Medtronic  Family History: Last updated: 03/26/2008 Family History of Arthritis uncle with MI in his 25's  Social History: Last updated: 04/19/2010 Former Smoker Alcohol use-no Married 4 children semiretired  -  Brewing technologist, works now part Scientific laboratory technician at [employer] use-no  Risk Factors: Smoking Status: quit (01/08/2007)  Social History: Former Smoker Alcohol  use-no Married 4 children semiretired  -  Brewing technologist, 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         all otherwise negative per pt -    Physical Exam  General:  alert and overweight-appearing.   Head:  normocephalic and atraumatic.   Eyes:  vision grossly intact, pupils equal, and pupils round.   Ears:  R ear normal and L ear normal.   Nose:  no external deformity and no nasal discharge.   Mouth:  no gingival abnormalities and pharynx pink and moist.   Neck:  supple and no masses.   Lungs:  normal respiratory effort and normal breath sounds.   Heart:  normal rate and regular rhythm.   Abdomen:  soft, non-tender, and normal bowel sounds.   Msk:  no joint tenderness and no joint swelling.   Extremities:  no edema, no erythema  Neurologic:  cranial nerves II-XII intact and strength normal in all extremities.   Skin:  2 cm prob subq lipoma right upper back, nontender nonfluctuance, nontender Psych:  not anxious appearing and not depressed appearing.     Impression & Recommendations:  Problem # 1:  Preventive Health Care (ICD-V70.0) Overall doing well, age appropriate education and counseling updated, referral for preventive services and immunizations addressed, dietary counseling and smoking status adressed , most recent labs reviewed I have personally reviewed and have noted 1.The patient's medical and social history 2.Their use of alcohol, tobacco or illicit drugs 3.Their current medications and supplements 4. Functional ability including ADL's, fall risk, home safety risk, hearing & visual impairment  5.Diet and physical activities 6.Evidence for depression or mood disorders The patients weight, height, BMI  have been recorded in the chart I have made referrals, counseling and provided education to the patient based review of the above  Orders: TLB-BMP (Basic Metabolic Panel-BMET) (80048-METABOL) TLB-CBC Platelet -  w/Differential (85025-CBCD) TLB-Hepatic/Liver Function Pnl (80076-HEPATIC) TLB-Lipid Panel (80061-LIPID) TLB-TSH (Thyroid Stimulating Hormone) (84443-TSH) TLB-Udip ONLY (81003-UDIP)  Problem # 2:  HYPERTENSION (ICD-401.9)  His updated medication list for this problem includes:    Lisinopril-hydrochlorothiazide 20-25 Mg Tabs (Lisinopril-hydrochlorothiazide) .Marland Kitchen... 1 by mouth once daily  BP today: 124/68 Prior BP: 155/79 (12/13/2009)  Labs Reviewed: K+: 4.0 (10/19/2009) Creat: : 1.0 (10/19/2009)   Chol: 164 (10/19/2009)   HDL: 50.40 (10/19/2009)   LDL: 99 (10/19/2009)   TG: 71.0 (10/19/2009) stable overall by hx and exam, ok to continue meds/tx as is   Complete Medication List: 1)  Lisinopril-hydrochlorothiazide 20-25 Mg Tabs (Lisinopril-hydrochlorothiazide) .Marland Kitchen.. 1 by mouth once daily 2)  Istalol 0.5 % Soln (Timolol maleate) .Marland Kitchen.. 1 gtt ou qd 3)  Ecotrin Low Strength 81 Mg Tbec (Aspirin) .Marland Kitchen.. 1 by mouth qd 4)  Simvastatin 40 Mg Tabs (Simvastatin) .Marland Kitchen.. 1po once daily  Patient Instructions: 1)  Continue all previous medications as before this visit 2)  Please go to the Lab in the basement for your blood and/or urine tests today 3)  Please call the number on the [facility] Card for results of your testing  4)  Please schedule a follow-up appointment in 1 year, or sooner if needed Prescriptions: SIMVASTATIN 40  MG TABS (SIMVASTATIN) 1po once daily  #90 x 3   Entered and Authorized by:   Corwin Levins MD   Signed by:   Corwin Levins MD on 04/19/2010   Method used:   Electronically to        [facility] (retail)       [address]       [place], [place]  [address]       Ph: [phone]       Fax: [phone]   RxID:   [id number] LISINOPRIL-HYDROCHLOROTHIAZIDE 20-25 MG TABS (LISINOPRIL-HYDROCHLOROTHIAZIDE) 1 by mouth once daily  #90 x 3   Entered and Authorized by:   Corwin Levins MD   Signed by:   Corwin Levins MD on 04/19/2010   Method used:    Electronically to        [facility] (retail)       [address]       [place], [place]  [address]       Ph: [phone]       Fax: [phone]   RxID:   [id number]    Orders Added: 1)  TLB-BMP (Basic Metabolic Panel-BMET) [80048-METABOL] 2)  TLB-CBC Platelet - w/Differential [85025-CBCD] 3)  TLB-Hepatic/Liver Function Pnl [80076-HEPATIC] 4)  TLB-Lipid Panel [80061-LIPID] 5)  TLB-TSH (Thyroid Stimulating Hormone) [84443-TSH] 6)  TLB-Udip ONLY [81003-UDIP] 7)  Est. Patient 65& > [60630]

## 2010-05-03 NOTE — Cardiovascular Report (Signed)
Summary: Office Visit Remote   Office Visit Remote   Imported By: Roderic Ovens 04/25/2010 15:20:04  _____________________________________________________________________  External Attachment:    Type:   Image     Comment:   External Document

## 2010-05-03 NOTE — Letter (Signed)
Summary: Remote Device Check  [facility], Main Office  [address]   [place], [place] [address]   Phone: [phone]  Fax: [phone]     April 23, 2010 MRN: [id number]   MELISSA PULIDO [address] [place], [place]  [address]   Dear Mr. SOOKDEO,   Your remote transmission was recieved and reviewed by your physician.  All diagnostics were within normal limits for you.  __X___Your next transmission is scheduled for:  06-15-2010.  Please transmit at any time this day.  If you have a wireless device your transmission will be sent automatically.  Sincerely,  Vella Kohler

## 2010-05-04 ENCOUNTER — Encounter: Payer: Self-pay | Admitting: Internal Medicine

## 2010-05-18 NOTE — Letter (Signed)
[facility]: [facility]   Imported By: Sherian Rein 05/09/2010 14:07:30  _____________________________________________________________________  External Attachment:    Type:   Image     Comment:   External Document

## 2010-06-15 ENCOUNTER — Ambulatory Visit (INDEPENDENT_AMBULATORY_CARE_PROVIDER_SITE_OTHER): Payer: Medicare Other | Admitting: *Deleted

## 2010-06-15 DIAGNOSIS — I442 Atrioventricular block, complete: Secondary | ICD-10-CM

## 2010-06-15 DIAGNOSIS — R0989 Other specified symptoms and signs involving the circulatory and respiratory systems: Secondary | ICD-10-CM

## 2010-06-15 DIAGNOSIS — Z950 Presence of cardiac pacemaker: Secondary | ICD-10-CM

## 2010-06-16 ENCOUNTER — Other Ambulatory Visit: Payer: Self-pay

## 2010-06-20 NOTE — Progress Notes (Signed)
Pacer remote check  

## 2010-07-02 ENCOUNTER — Encounter: Payer: Self-pay | Admitting: *Deleted

## 2010-07-25 NOTE — Discharge Summary (Signed)
NAME:  [name], [name] NO.:  [id number]   MEDICAL RECORD NO.:  [id number]          PATIENT TYPE:  OIB   LOCATION:  2899                         FACILITY:  [facility]   PHYSICIAN:  Duke Salvia, MD, FACCDATE OF BIRTH:  [birth year]   DATE OF ADMISSION:  10/21/2007  DATE OF DISCHARGE:  10/21/2007                               DISCHARGE SUMMARY   This patient has no known drug allergies.   FINAL DIAGNOSES:  1. History of pacemaker implantation for syncope and trifascicular      block in July 2001.  2. Pacemaker currently at elective replacement indicator.  3. Explant of Medtronic [id number] on October 21, 2007.  Implant Medtronic      Versa dual-chamber pacemaker on October 21, 2007.   SECONDARY DIAGNOSES:  1. Benign prostatic hypertrophy.  2. Hypertension with moderate uncontrolled systolic blood pressures      this admission were 189 with a smaller cuff and 170 systolic with a      larger cuff.  This correlates with a blood pressure of 170/87      during office visit on September 25, 2007.  At that time, the patient      declined any further blood pressure medication suggestion.  3. Left carotid bruit last ultrasound was in 2001.  No internal      carotid artery stenosis.  4. Myoview study in July 2001, ejection fraction of 58%.  No ischemia.   PROCEDURES:  On October 21, 2007, explantation of existing pacemaker with  generator change, implantation of Medtronic Versa dual-chamber  pacemaker.  The procedure was successful.  The patient had no  postprocedural complications.  He is asked to remove his bandage on the  morning after the procedure on Wednesday, August 12.  He is asked to  keep the incision dry for whole week until Tuesday, August 18.   BRIEF HISTORY:  [name] is an 77 year old male.  He has a Medtronic  Kappa pacemaker implanted for syncope.  It was implanted on October 04, 1999.   When asked if he has ever had recurrence of his syncopal episodes, which  caused pacemaker implantation, he says that after a very full meal he  has a tendency to pass out on the table.  Electrocardiogram at office  visit shows sinus rhythm, heart rate of 77, and QRS of 152 milliseconds.   The patient's device was interrogated on August 13, 2007.  It was found to  be at [facility].  The patient had office visit on July 16.  At that time, his  blood pressure was 170/87.  The patient does deny elevated blood  pressures because he is taking a blood pressure medication and he says  that his blood pressure has always elevated at doctor visits.  The  patient does not complain of chest pain.  He does not get winded with  exertion.  The patient says he can walk 100 yards if he goes slowly.  The risk and benefits of pacemaker generator change were outlined to the  patient.  Postprocedure incision care has also been discussed.  The  patient  will present electively on October 21, 2007, for this procedure  with Dr. Beverly Sessions COURSE:  The patient presents electively on October 21, 2007.  He had successful generator change with implantation of the Medtronic  Versa dual-chamber pacemaker.  As mentioned above, his blood pressure  has been elevated.  With a regular size cuff, it was 180 systolic, and  changed to a large cuff, it became 170 systolic.  The recommendation for  additional antihypertensive medication is made.  It is suggested he take  labetalol 100 mg twice daily to begin.   His other medications include:  1. Lisinopril/hydrochlorothiazide 20/25 one tab daily.  2. Enteric-coated aspirin 81 mg daily.  3. Istalol 0.5% ophthalmic solution one drop both eyes daily.   He follows up at [facility] [address] on  Thursday, August 27, at 8 o'clock for carotid duplex ultrasound.  The  patient has known left carotid bruit and his last ultrasound was in July  2001.  At 9:40 a.m. on August 27, he will report to the [facility] to  have his incision  check.  Once again additional blood pressure  medication would be lisinopril 100 mg twice daily.   IT IS RECOMMENDED THAT WE TAKE HIS BLOOD PRESSURE ON AUGUST 27, AT THE  PACER CLINIC.  AT THAT TIME, HE MAY BE TAKING AN ADDITIONAL MEDICATION,  LABETALOL 100 MG TWICE DAILY TO SUPPLEMENT HIS  LISINOPRIL/HYDROCHLOROTHIAZIDE TABLET.      Maple Mirza, [place]      Duke Salvia, MD, [facility]  Electronically Signed    GM/MEDQ  D:  10/21/2007  T:  10/22/2007  Job:  [id number]

## 2010-07-25 NOTE — Assessment & Plan Note (Signed)
[facility]                         ELECTROPHYSIOLOGY OFFICE NOTE   NAME:[name], Ansell                      MRN:          [id number]  DATE:11/26/2006                            DOB:          1925/07/12    [name] comes in following pacemaker implantation about 7 years ago  for syncope. He has had no recurrent syncope.   He has no complaints of chest pain or shortness of breath.   His only medication is lisinopril/HCTZ 20/25.   PHYSICAL EXAMINATION:  VITAL SIGNS:  His blood pressure today was 160/87  but he says this is always true in the office and at the drugstore it is  always in the 120 range. His pulse was 85.  LUNGS:  Clear.  HEART:  Heart sounds were regular.  NECK:  Neck veins were flat.  EXTREMITIES:  Without edema.   Interrogation of his Medtronic Kappa 700 pulse generator demonstrates a  P wave of 5.6 with impedance of 430, threshold of 0.5 at 0.4. The R wave  was 8 with impedance of 1037, threshold of 0.75 at 0.4, battery voltage  2.68 with an estimated longevity of 13 months.   IMPRESSION:  1. Syncope.  2. Trifascicular block complicated by high-grade AV block.  3. Status post pacemaker for the above.  4. Hypertension.   [name] is doing fine. I should note as I reviewed his chart that  he has an old carotid bruit which was not reassessed today. This is  something we probably want to look at in this 77 year old gentleman.   We will see him again in 1 year's time.     Duke Salvia, MD, [facility]  Electronically Signed    SCK/MedQ  DD: 11/26/2006  DT: 11/27/2006  Job #: [id number]

## 2010-07-25 NOTE — Assessment & Plan Note (Signed)
[facility]                         ELECTROPHYSIOLOGY OFFICE NOTE   NAME:WILLIAMSAsbury, Hair                      MRN:          [id number]  DATE:04/06/2008                            DOB:          1925-06-17    HISTORY OF PRESENT ILLNESS:  Mr. Becraft is seen in followup for  pacemaker implanted for high-grade heart block with subsequent generator  replacement in August of 2009.  He is doing well without complaints of  chest pain or shortness of breath.   MEDICATIONS:  His medications include  1. Lisinopril HCT 20/25.  2. Aspirin.  3. Simvastatin.   PHYSICAL EXAMINATION:  GENERAL:  His appearance belies his 77 years of  age.  VITAL SIGNS:  His blood pressure is 142/82 with a pulse of 89.  Weight  was 208, which is stable.  LUNGS:  Clear.  HEART:  Sounds were regular.  ABDOMEN:  Soft.  EXTREMITIES:  Without edema.   Interrogation of his Medtronic pulse generator demonstrates P-wave of  5.6, impedance of 441 with threshold 0.5 at 0.1.  The R-waves 8 with  impedance of 1104 and threshold 0.75 at 0.4.  Battery voltage is 2.79.   IMPRESSION:  1. 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  2. Syncope associated the high-grade heart block.  3. Status post pacer for the above.   PLAN:  Mr. Quirk is stable.  We will plan to see him again in 1  year's time.     Duke Salvia, MD, [facility]  Electronically Signed    SCK/MedQ  DD: 04/06/2008  DT: 04/07/2008  Job #: [id number]

## 2010-07-25 NOTE — Assessment & Plan Note (Signed)
[facility]                         ELECTROPHYSIOLOGY OFFICE NOTE   NAME:[name], [name]                      MRN:          [id number]  DATE:09/25/2007                            DOB:          [birth year]    ALLERGIES:  The patient has no known drug allergies.   BRIEF HISTORY:  This is an 77 year old male.  He has a Medtronic Kappa  pacemaker implanted for syncope.  It was implanted October 04, 1999.  This  gentlemen certainly does not look his stated age and still works in the  security.  When asked if he has never had recurrence of his syncope, he  says that if he eats a very full meal, he has a tendency to pass out at  the table.  I asked him again, if there is something that your wife has  observed, he said yes.  He states, he has never had this happening while  he is driving or when he is at work.  Electrocardiogram today shows the  patient to be in sinus rhythm with a heart rate of 77.  QRS of 152  milliseconds.  The patient with magnet on is V-pacing at 60.   PAST MEDICAL HISTORY:  The patient's past medical history also includes  hypertension and benign prostatic hypertrophy.   His blood pressure today is 170 systolic and the patient is on  lisinopril/hydrochlorothiazide 20/25 one tab daily.   The patient's device was interrogated on August 13, 2007, and was found to  be a elective replacement indicator.  He comes to the office today for  workup prior to having the generator changed.  I must add that the  patient had a Myoview study, July 2001, ejection fraction 58% and no  ischemia.  The patient has left carotid bruit with a carotid duplex  ultrasound in 2001, which showed that there was no internal carotid  artery stenosis, but I feel that there has not been anything at least  not on the chart to show serial surveillance.   The patient does not complain of chest pain.  He does not get  particularly winded.  He could walk, he says, 100 yards if  he goes slow.  If he has recurrent syncope as described above, a transient loss of  consciousness after eating a full and heavy meal.   PHYSICAL EXAMINATION:  VITAL SIGNS:  Today, his weight is 211, which is  slightly down from his office visit on Jul 15, 2007.  Blood pressure  170/87 and pulse is 76.  LUNGS:  His lungs are clear with auscultation.  He has a mild left  carotid bruit which persists and no right carotid bruits.  HEART:  Regular rate and rhythm without murmur.  ABDOMEN:  Soft mildly obese, nondistended, and nontender.  EXTREMITIES:  Lower extremities without edema, cyanosis, or clubbing.   MEDICATIONS:  The patient's  medications are as follows:  1. Lisinopril/hydrochlorothiazide 20/25 daily.  2. Istalol 5 mg eye drops daily.  3. Enteric-coated aspirin 81 mg daily.   I approached him about additional medication for his blood pressure.  He  says  that his blood pressure was always higher here in the doctor's  office.  He says, if measured at an outside Pharmacy, it is always lower  -- 120-130 systolic.  I recommended Norvasc 5 mg tablets, but he does  not seem very eager to start additional medication.  I said to him that  we will followup his blood pressure when he presents for a generator  change and at that time he may need augmentation for his hypertension.  The patient is set for pacemaker generator change on October 21, 2007, at  [facility].  Laboratory studies will be drawn on October 13, 2007, [facility] office.      Maple Mirza, PA  Electronically Signed      Duke Salvia, MD, [facility]  Electronically Signed   GM/MedQ  DD: 09/25/2007  DT: 09/26/2007  Job #: [id number]

## 2010-07-25 NOTE — Assessment & Plan Note (Signed)
[facility]                         ELECTROPHYSIOLOGY OFFICE NOTE   NAME:WILLIAMSLeman, Martinek                      MRN:          [id number]  DATE:07/15/2007                            DOB:          January 24, 1926    Mr. Kopka is seen in followup for pacemaker implanted about 8 years  ago for syncope in the setting of trifascicular block.  He is paced 100%  of the time but is not device dependent.  He is approaching ERI.  His  shortness of breath symptoms are stable.  He has had no episodes of  light headedness or syncope.  He does not have chest pain and he does  not have any shortness of breath.   MEDICATIONS:  His medications are quite sparse and include:  1. Lisinopril/HCT 20/25.  2. Aspirin 81.  3. Eye drops.   PHYSICAL EXAMINATION:  VITAL SIGNS:  On examination today his blood  pressure was modestly elevated at 151/88 with pulse of 96.  LUNGS:  Clear.  HEART:  Sounds were regular.  EXTREMITIES:  Were without edema.   Interrogation of his device demonstrated a P wave of 5.76 with impedance  of 438 and threshold of 0.5 and 0.4.  The R wave is 8 with impedance of  1118 and threshold of 0.75 and 0.52; battery voltage is 2.6.  Estimated  longevity is 1 month.   He will be checked again on August 13, 2007.  He is approaching ERI.  I  have asked him to keep close watch of his blood pressure and follow this  up with Dr. Jonny Ruiz.     Duke Salvia, MD, [facility]  Electronically Signed    SCK/MedQ  DD: 07/15/2007  DT: 07/15/2007  Job #: [id number]

## 2010-07-28 NOTE — Op Note (Signed)
[place]. [facility]  Patient:    [name], [name]                      MRN: [id number] Proc. Date: 10/04/99 Adm. Date:  [id number] Attending:  Nathen May CC:         Electrophysiological Lab             [place] attn Freddi Che, M.D. [facility]                           Operative Report  PREOPERATIVE DIAGNOSES:  Syncope with trifascicular block, normal left ventricular function, negative Cardiolite.  POSTOPERATIVE DIAGNOSES:  Syncope with trifascicular block, normal left ventricular function, negative Cardiolite.  PROCEDURE:  Contrast stenography and implantation of a dual chamber rate responsive pacemaker.  DESCRIPTION OF PROCEDURE:  Following the obtainment of informed consent, the patient was brought to the electrophysiology laboratory and placed on the fluoroscopic table in supine position.  After routine prepping and draping of the left upper chest, intravenous contrast was injected via the left antecubital vein to identify the course of the apex through the subclavian vein.  This having been accomplished, lidocaine was infiltrated in the prepectoral subclavicular region.  An incision was made extending down to the layer of the prepectoral fascia using electrocautery.  A pocket was formed using electrocautery.  Hemostasis was obtained.  Thereafter, attention was turned again, access to the exit of subclavian vein which was accomplished without difficulty without the aspiration of air or puncture of the artery.  Two guide wires were placed and retained, and a hemostatic suture was placed in a figure-of-eight fashion and allowed to hang loosely.  Thereafter, two 9 French ______ introducer sheaths were placed that allowed for the sequential passage of a pace setter passive fixation ventricular lead which was subsequently removed, and a pace setter passive atrial lead model 1342 52 cm in length, serial number [id number].   Under fluoroscopic guidance the initial ventricular lead was manipulated to the right ventricular apex where stable perimeters were identified.  It was secured to the prepectoral fascia.  Thereafter, the atrial lead was manipulated to the right atrial appendage where the bipolar P-wave was 6.7 millivolts with an impedance of 455 ohms, a pacing threshold of 0.5 milliseconds at 0.6 volts, and a current threshold of 1.4 ma.  With these acceptable perimeters recorded, this lead was also secured to the prepectoral fascia and a hemostatic suture was secured.  However, at this junction the hemostatic suture tore, and was removed.  I then placed a 0 silk tearaway sheath underneath the paired collars of the two leads, and I inadvertently punctured the collar and the ventricular lead at that juncture.  Measurements of the R-wave were 4.5 millivolts, and it was decided to abandon the lead. The lead was removed, and attempts were made to maintain access through a ______ puncture wire to the vein via the abandoned and soon to be extracted lead.  This, however, failed, as the retained guide wire failed to dislodge from the lead, and the defected lead removed with it, the wire that was supposed to be retained.  Thereafter, attention had to be returned again to regain access of the extra thoracic left subclavian vein which was accomplished now with modest difficulty.  The vein was cannulated on a number of cases, but the wire  could not be passed.  Ultimately, the wire did pass, and with puncture via the micro puncture system.  The sheath was hooked up to a 03 guide wire, and a 9 French sheath was passed, and then subsequently a Medtronic 5940 58 cm length lead, serial number [id number] was passed under fluoroscopic guidance to the right ventricular apex.  The bipolar R-wave was 8.0 millivolts with an impendance of 955 ohms, and a pacing threshold of 0.6 volts at 0.5 milliseconds.  There was no  diaphragmatic pacing at 10 volts with a current threshold of 0.8 ma.  With these acceptable parameters recorded, the pocket was irrigated copiously with antibiotic containing saline solution.  Hemostasis was obtained and ensured, and the leads of the pulse generator were then placed in the pocket, secured to the prepectoral fascia.  The wound was then closed in three layers in a normal fashion.  The wound was washed, dried, and benzoin and Steri-Strips dressing was then applied.  Sponge, needle, and instrument counts were correct at the end of the procedure according to the nurses. DD:  10/04/99 TD:  10/05/99 Job: [id number]

## 2010-07-28 NOTE — Discharge Summary (Signed)
[place]  Patient:    [name], [name]                      MRN: [id number] Adm. Date:  [id number] Disc. Date: 10/05/99 Attending:  Nathen May Dictator:   Tereso Newcomer, P.A. CC:         Corwin Levins, M.D. [facility]             Nathen May, M.D., [facility]                           Discharge Summary  DATE OF BIRTH:  07-16-1925  REASON FOR ADMISSION:  Syncope.  PROCEDURES PERFORMED THIS ADMISSION: 1. Status post permanent pacemaker insertion on October 04, 1999, with a    Medtronic device, manufacturer number [id number], lead model 1342P/52, serial    number [id number]. 2. Exercise Cardiolite on October 03, 1999.  The patient exercised 5 minutes    30 seconds under the Bruce protocol without chest pain.  His heart rate    was greater than 100% predicted maximal heart rate.  Cardiolite images    were without ischemia, EF 58%. 3. A 2-D echocardiogram on October 02, 1999, LV size upper limit of normal.    Normal contraction.  Trace to mild MR with mitral annular calcification.    Aortic sclerosis.  LV EF grossly normal.  DISCHARGE DIAGNOSES: 1. Status post permanent pacemaker. 2. Borderline hypertension. 3. History of benign prostatic hypertrophy. 4. 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 5. Left carotid bruit by exam.  ADMISSION HISTORY:  This patient was originally admitted by Dr. Kelle Darting of the internal medicine service for syncope.  Our service was consulted on October 01, 1999, and eventually took over the patients care.  He is a 77 year old African-American male with history of syncope in 1997.  The patient was admitted on July 21 after experiencing a syncopal episode.  He was at a high school reunion, sitting at a dinner table, when he suddenly felt dizzy and passed out shortly after this with loss of consciousness.  He was reportedly out for several  minutes.  No seizure activity was noted.  He did, however, have urinary incontinence.  There was no history of tongue bite.  He denied substernal chest pain or shortness of breath prior to his syncopal episode.  He denied any nausea or vomiting.  He denied any history of vasovagal syncope.  He denied any orthopnea or PND or limitations to his exercise tolerance.  CT scan performed of his head was negative on admission.  PHYSICAL EXAMINATION:  Initial physician exam by Dr. Andee Lineman revealed a well-nourished black male in no apparent distress.  Blood pressure 205/102, heart rate 62, respirations 16, O2 saturation 99%.  Neck without JVD. Positive left-sided carotid bruit; no bruit on the right.  Lungs clear.  Hear regular rate and rhythm, normal S1 and S2.  A soft systolic murmur was noted at the left upper sternal border.  No S3 or S4 noted.  Abdomen soft and nontender.  Extremities without cyanosis, clubbing, or edema.  LABORATORY DATA:  A 12-lead EKG revealed normal sinus rhythm with first degree A-V block, left anterior hemiblock, and right bundle branch block.  Left ventricular hypertrophy consistent with secondary QRS widening.  These findings were unchanged from prior EKG of 1997.  Initial laboratory  data revealed a white blood cell count of 5300, hemoglobin 13.3, hematocrit 40.3, platelet count 192,000.  Sodium 141, potassium 4.0, chloride 111, CO2 26, glucose 129, BUN 13, creatinine 1.0, total bilirubin 0.6, alkaline phosphatase 51, SGOT 22, SGPT 9, total protein 6.9, albumin 3.5, calcium 9.0.  Total CK 147, CK-MB 3.5, troponin I 0.09.  HOSPITAL COURSE:  It was felt that the patients syncope was probably related to his conduction system given his trifascicular block.  A 2-D echocardiogram was performed to rule out structural disease, and this was normal as noted above.  Also, the patients cardiac enzymes were as follows, total CK #2, 115; CK-MB 2.6.  Troponin I 0.12.  Given the  chronically elevated troponin I, it was felt the patient should have Cardiolite test performed to rule out ischemia.  An exercise Cardiolite was performed on October 03, 1999.  The patient exercised for a total of 5 minutes and 30 seconds on Bruce protocol without chest pain.  His maximum heart rate was 158 which was greater than 100% predicted maximal heart rate.  His blood pressure rose to 155/80.  Cardiolite images revealed no ischemia and EF of 58%.  The patient was seen by Dr. Graciela Husbands on October 04, 1999.  He felt that the patients syncope was likely resulting from trifascicular block.  He recommended the patient go for pacemaker insertion.  The patient had this procedure performed on October 04, 1999.  As noted above, he had a Medtronic pacemaker implanted.  On the morning of October 05, 1999, the pacer site looked good without hematoma.  It was decided the patient was stable enough for discharge and could be discharged to home without any medication.  The patients blood pressure was mostly less than 140/80 throughout admission, and it was felt that close followup as an outpatient would help decide whether or not the patient needed a antihypertensive.  DISCHARGE LABORATORY DATA:  Hemoglobin 14.3, hematocrit 43.8, white blood cell count 4600, platelet count 200,000.  INR 1.1.  Sodium 138, potassium 4.4, chloride 107, CO2 25, glucose 115, BUN 9, creatinine 1.0.  TSH 0.649. Hemoglobin A1C 5.8.  The patients chest x-ray on September 30, 1999, was without acute cardiopulmonary disease.  DISCHARGE MEDICATIONS:  As noted above, none.  ACTIVITY:  Limited left arm use until seen in the office.  WOUND CARE:  He is to keep his wound dry until Monday of next week, that is October 09, 1999.  FOLLOWUP:  With the physician assistant, Dian Queen, at [facility] on Tuesday, October 17, 1999, at 10 a.m.  The patient should also follow up with Dr. Jonny Ruiz on August 9 at 11:20 a.m. for blood pressure check and  decision for antihypertensive medication.  Also as an outpatient, the patient should be  followed up for his left carotid bruit noted on initial examination by Dr. Andee Lineman. DD:  10/05/99 TD:  10/05/99 Job: [id number]

## 2010-07-28 NOTE — Discharge Summary (Signed)
[facility]  Patient:    [name], [name]                      MRN: [id number] Adm. Date:  [id number] Disc. Date: [id number] Attending:  Patricia Nettle Dictator:   Tereso Newcomer, P.A. CC:         Shelby Dubin, [facility], c/o Lewayne Bunting, M.D.             Corwin Levins, M.D. [facility]                           Discharge Summary  DATE OF BIRTH:  08-09-1925.  REASON FOR ADMISSION:  Left upper extremity DVT.  Patient is status post permanent pacemaker implantation on October 04, 1999.  DISCHARGE DIAGNOSES 1. Left upper extremity deep venous thrombosis. 2. Status post permanent pacemaker placement, September 14, 1999, with Medtronic    device, manufacturer #[id number], lead model [id number], serial [id number]. 3. History of syncope with trifascicular block, reason for #2. 4. Exercise Cardiolite, October 03, 1999, negative for ischemia. 5. Two-dimensional echocardiogram, October 02, 1999:  Left ventricular size upper    limits of normal, normal contraction, trace-to-mild mitral regurgitation,    aortic sclerosis, normal ejection fraction. 6. History of benign prostatic hypertrophy. 7. Hypertension.  ADMISSION HISTORY:  [name] presented to the office on October 25, 1999 complaining of swelling in the left arm associated with redness and some pain. He denied any fevers or chills.  He denies any shortness of breath.  He had no orthopnea or PND.  Upper extremity Dopplers at the vascular lab revealed a thrombus in the distal subclavian, axillary and brachial veins, thrombosed down to the elbow.  PHYSICAL EXAMINATION:  Initial physical exam revealed blood pressure 150/78, pulse 80.  NECK:  Without JVD.  Positive left carotid bruit.  LUNGS:  Clear bilaterally.  HEART:  Regular rate and rhythm.  Normal S1 and S2.  No S3. ABDOMEN:  Soft, nontender.  No rebound or guarding.  EXTREMITIES:  Peripheral pulses 2+.  Marked edema in the left arm, all the way down to the wrist.  Good pulses are  palpable in the left arm.  There is a palpable cord in the left upper extremity.  There is no erythema or swelling in the other upper extremity.  HISTORY:  It was decided that patient needed to be admitted to the hospital for initiation of anticoagulation therapy.  He was begun on Lovenox and Coumadin.  His initial INR was 1.2; his INR at the time of discharge is 1.3. Patient received a total of two doses of Coumadin 5 mg prior to discharge.  He did well throughout his admission.  He denied any chest pain or shortness of breath.  The swelling in his arm improved slightly.  His blood pressure was noted to be somewhat elevated systolically throughout his admission.  Review of records from last admission revealed a borderline hypertension.  His blood pressures ranged from 166/91 to 153/80 throughout admission; therefore, low-dose Dyazide diuretic was started to treat his blood pressure.  On the morning of October 27, 1999, it was felt he was stable enough for discharge to home.  He had some difficulties in administering Lovenox to himself; therefore, it was decided that home health would be utilized to help give him his Lovenox injections until his INR becomes therapeutic.  Close followup over the weekend would be initiated.  The patient will have  his INR checked by home health on Saturday, October 28, 1999, and the results will be paged to our pharmacist in the [facility], Shelby Dubin.  The patient will also be seen in the office on Monday, October 30, 1999, by the physician assistant to check the patients left upper extremity and check his CBC and basic metabolic panel as well as to have his INR checked by the [facility].  Once his INR is therapeutic, the patient can have the Lovenox discontinued.  LABORATORY DATA:  Sodium 137, potassium 3.9, chloride 108, CO2 24, glucose 97, BUN 12, creatinine 1.1.  WBC 5000, hemoglobin 12.6, hematocrit 36.5, platelet count 193,000.  DISCHARGE  MEDICATIONS 1. Coumadin 5 mg q.d. 2. Lovenox 90 mg subcu q.12h. until INR is therapeutic. 3. Maxzide 25 mg half a tablet p.o. q.d.  ACTIVITY:  No heavy lifting or exertion with the left arm until swelling is better.  DIET:  Low-fat, low-sodium, low-cholesterol diet.  FOLLOWUP:  Followup is with the physician assistant on Monday, October 30, 1999, at 8:30 a.m.  Patient is to have INR checked with the [facility] on that date as well after his appointment.  He should have his INR rechecked by the home health agency on Saturday, October 28, 1999, and the results will be paged to [facility]. DD:  10/27/99 TD:  10/27/99 Job: [id number]

## 2010-07-28 NOTE — Assessment & Plan Note (Signed)
[facility]                           GASTROENTEROLOGY OFFICE NOTE   NAME:[name], [name]                      MRN:          [id number]  DATE:12/26/2005                            DOB:          11/05/1925   Patient referred for a colonoscopy examination. He is on Coumadin, denies  any other problems.  He is on Coumadin for his heart disease.  His last  colonoscopic examination was done in May of 2003 and revealed a number of  large polyps which were removed.  One was a tubular adenoma.   PAST MEDICAL HISTORY:  Essentially noncontributory.   He does not smoke or drink.   REVIEW OF SYSTEMS:  He did not put down anything.   FAMILY HISTORY:  He put down nothing of significance.   PHYSICAL EXAMINATION:  He was 5 feet 10 inches.  Weight 212.  Blood pressure  140/72.  Pulse 80 and regular.  He was moderately obese.  His oropharynx was negative.  His neck was negative.  Supraclavicular was negative.  CHEST:  Was clear.  ABDOMEN:  Was soft.  No masses or organomegaly.  EXTREMITIES:  Are unremarkable.  RECTAL:  Deferred.   IMPRESSION:  1. Previous history of multiple large colon polyps.  2. Hypertension.  3. Hypercholesterolemia.  4. History of syncope with __________ block requiring pacemaker.  5. History of BPH.   MEDICATION:  Maxzide and eye drops.   RECOMMENDATIONS:  That he be scheduled for a colonoscopy examination  sometime in the near future.            ______________________________  Ulyess Mort, MD   SML/MedQ DD:  12/26/2005 DT:  12/28/2005 Job #:  [id number]   cc:   Corwin Levins, MD

## 2010-07-28 NOTE — Assessment & Plan Note (Signed)
[facility]                           GASTROENTEROLOGY OFFICE NOTE   NAME:[name], [name]                      MRN:          [id number]  DATE:12/26/2005                            DOB:          1925-06-12    This very nice gentleman comes in on December 26, 2005 referred by Dr. Jonny Ruiz.  He is not on Coumadin, denies any problem.  He says he thinks he probably  needs a colonoscopy examination.  His last procedure was on Aug 05, 2001 and  he had multiple very large polyps, indicated at the time that he should have  a 6 month followup.            ______________________________  Ulyess Mort, MD      SML/MedQ  DD:  12/26/2005  DT:  12/28/2005  Job #:  [id number]

## 2010-07-28 NOTE — Consult Note (Signed)
[facility]  Patient:    DICKEY, CAAMANO                      MRN: [id number] Proc. Date: 10/01/99 Adm. Date:  [id number] Attending:  Evette Georges CC:         Evette Georges, M.D.             Corwin Levins, M.D.                          Consultation Report  REASON FOR CONSULTATION:  Syncope.  HISTORY OF PRESENT ILLNESS:  Mr. Galicia is a 77 year old African-American male with a past medical history of syncope in 1997.  The patient also has a history of hypertension and benign prostatic hypertrophy followed by Dr. Annabell Howells.  The patient was admitted last night after he had experienced a syncopal episode.  The patient was at a high school reunion and was sitting at the dinner table when suddenly he felt dizzy and shortly after this, passed out and lost consciousness.  The patient reportedly was out for several minutes.  This was according to bystanders.  Reportedly, there was no evidence of seizure activity.  The patient did report urinary incontinence.  There was no history of tongue bite.  The patient states he had one prior episode approximately three years ago when he was admitted to [facility]. At that time it was felt this was secondary to micturition syncope.  The patient did not report any substernal chest pain or shortness of breath prior to this syncopal episode.  He also reported no nausea or vomiting.  The patient has no prior history of vasovagal syncope.  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  The patient denies currently any orthopnea or PND.  He has no limitations in his exercise tolerance.  He states that he is currently not on any medical therapy, although reportedly had hypertension.  He denies any recent palpitations and there has been no recent presyncope.  The patient has not been  involved in trauma recently.  He did receive a CT scan in the emergency room on admission which was reportedly negative for intracranial pathology.  PAST MEDICAL HISTORY: 1. Borderline hypertension. 2. History of benign prostatic hypertrophy. 3. History of syncope in 1997 felt to be secondary to micturition syncope. 4. 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  ALLERGIES:  No known drug allergies.  FAMILY HISTORY:  Negative for coronary artery disease.  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  He does not smoke.  He does not drink alcohol.  There is no history of cocaine use.  REVIEW OF SYSTEMS:  No nausea or vomiting, no fever or chills.  No abdominal pain.  The remainder of review of systems is essentially within normal limits.  PHYSICAL EXAMINATION:  VITAL SIGNS:  Blood pressure 205/102, heart rate 62 beats per minute, respiratory rate is 16, saturation is 99%.  Temperature is 97.1.  GENERAL:  A well-nourished black male in no apparent distress.  NECK:  No JVD or abdominojugular reflex.  Left-sided carotid bruit, no bruit on the right side.  Normal carotid  upstroke bilaterally.  Neck is supple.  LUNGS:  Clear.  Breath sounds bilaterally, no wheezes.  HEART:  Regular rate and rhythm, normal S1, S2.  There is a soft systolic murmur at the left upper sternal border.  PMI is non-displaced.  There is no S3 or S4.  ABDOMEN:  Soft, nontender.  There is no rebound or guarding.  There are good bowel sounds.  EXTREMITIES:  2+ peripheral pulses.  There are no femoral bruits.  There is no abdominal bruit and there is no cyanosis, clubbing or edema.  NEUROLOGIC:  The patient is alert, oriented, grossly nonfocal.  LABORATORY DATA:  A 12-lead electrocardiogram shows normal sinus rhythm with first degree AV block, left anterior  hemiblock and right bundled branch block. Left ventricular hypertrophy consistent with secondary QRS widening.  These findings are unchanged from a prior electrocardiogram from 1997.  CK is 147, CKMB is 3.5.  CBC is essentially within normal limits with a hemoglobin of 13.3 and a white count of 5.3.  Glucose is 129, potassium is 4.0 creatinine is 1.0.  Liver function tests are within normal limits.  Troponin was mildly elevate do the initial set of 0.09, just above the upper limit of normal.  Blood gas was within normal limits with a PO2 of 94.4 and a PCO2 of 36.4, pH of 7.405.  Chest x-ray reportedly within normal limits, as well as a CT scan which the official reading is still pending but reportedly was within normal limits.  IMPRESSION AND PLAN: 1. Syncope.  The patients history could be consistent with neurologic cause    for syncope, particularly given the duration of his syncope and urinary    incontinence that was associated with it.  However, there was no    tonic-clonic seizure activity noted and I do suspect it is more likely the    patient had an underlying bradyarrhythmic event as a cause to his syncope.    The patient has significant conduction system disease with trifascicular    block by electrocardiogram.  This finding, however, is old and was    documented in 1997, at which time he also presented with another episode of    syncope.  I am concerned about the patients conduction system disease,    particularly as patients recurrent syncope and patients with trivesicular    was potentnially associated with an increased risk and sudden cardiac    death.  I recommend a further in-hospital work-up, including a 2D echo to    rule out structural heart disease as a cause for his conduction disease, as    well as ischemia evaluation with a stress Cardiolite study, particularly in    light of the elevated troponin and risk factors for coronary artery disease    and evidence of  peripheral vascular disease with a left carotid bruit.  If     these studies are negative, the patient should receive an EP evaluation and    I will consult with Dr. Ladona Ridgel and Dr. Graciela Husbands, whether this patient needs an    electrophysicological study, in particular, to measure his H-V interval.    If this is markedly prolonged in this current setting, this would be an    indication for permanent pacemaker placement.  I would also rule out the    patient further for myocardial infarction with serial troponins. 2. Hypertension.  The patient has marked hypertension.  It should be    controlled with medical therapy.  I will start an  ACE inhibitor.  Will need    to avoid AV nodal blocking agents. 3. Left carotid bruit.  I suspect this may have been worked up with carotid    Dopplers in the office and we will refer to Dr. Jonny Ruiz and Dr. Tawanna Cooler whether    this requires further evaluation at the present time.  DISPOSITION:  We will discuss with Dr. Graciela Husbands, Dr. Ladona Ridgel, further need for an invasive evaluation to rule out severe conduction system disease. DD:  10/01/99 TD:  10/02/99 Job: [id number]

## 2010-07-28 NOTE — H&P (Signed)
[facility]  Patient:    [name], [name]                      MRN: [id number] Adm. Date:  [id number] Attending:  Patricia Nettle CC:         Corwin Levins, M.D. [facility]             Nathen May, M.D., [facility]                         History and Physical  HISTORY OF PRESENT ILLNESS:  [name] is a 77 year old African-American male recently admitted status post syncope.  The patient was found to have trifascicular block complicated by high-grade AV block requiring permanent pacemaker implantation.  This procedure was performed approximately one week ago.  The patient noticed on Monday that he developed a swelling in the left arm associated with redness and some pain.  He denies any fevers or chills. He denies any shortness of breath.  He has no orthopnea or PND.  The patient was sent over to the vascular lab, where upper extremity Dopplers were performed, and he was found to have thrombus in the distal subclavian, axillary, and brachial veins, thrombosed down to the elbow.  PAST MEDICAL HISTORY: 1. Syncope with trifascicular block, status post permanent pacemaker placement    by Dr. Sherryl Manges on October 04, 1999, with a Medtronic device, manufacturer    number [id number], lead model 1342P-52, serial number [id number]. 2. Status post exercise Cardiolite October 03, 1999, negative for ischemia. 3. 2D echocardiogram October 02, 1999, LV size upper limits of normal, normal    contraction, trace to mild MR, aortic sclerosis, normal ejection fraction. 4. History of benign prostatic hypertrophy. 5. Left carotid bruit by exam.  ALLERGIES:  No known drug allergies.  FAMILY HISTORY:  Negative for coronary artery disease.  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  He does not smoke.  He does not drink alcohol.  There is no history of cocaine use.  MEDICATIONS:  No medications presently.  REVIEW OF  SYSTEMS:  No nausea or vomiting, no fevers or chills, no abdominal pain.  No orthopnea or PND.  PHYSICAL EXAMINATION:  VITAL SIGNS:  Blood pressure is 150/78, heart rate is 80 beats per minute.  NECK:  No JVD.  Left carotid bruit.  Neck is supple.  LUNGS:  Clear breath sounds bilaterally.  HEART:  Regular rate and rhythm, normal S1, S2.  No S3.  ABDOMEN:  Soft, nontender, no rebound or guarding.  EXTREMITIES:  Peripheral pulses 2+.  Marked edema in the left arm all the way down to the wrist.  Good pulses are palpable in the left arm.  There is a palpable cord in the left upper arm.  There is no erythema or swelling in the other upper extremity.  LABORATORY DATA:  Currently pending.  Twelve-lead electrocardiogram is pending.  Chest x-ray is currently pending.  IMPRESSION AND PLAN: 1. Status post pacemaker implantation complicated by upper arm deep vein    thrombosis.  The patient will be admitted to start antithrombotic therapy    with intravenous heparin versus Lovenox.  We will start the patient on    Lovenox 1 mg/kg subcutaneously q.12h.  In the a.m., the patient can be    started on Coumadin and then will likely require three to six months of  Coumadin therapy. 2. Status post pacemaker implantation.  Will check a chest x-ray to make sure    pacemaker is positioned well. 3. Hypertension.  The patient may require antihypertensive therapy prior to    discharge.  DISPOSITION:  If swelling improves, the patient can probably be sent home on home Lovenox and Coumadin. DD:  10/25/99 TD:  10/26/99 Job: [id number]

## 2010-07-28 NOTE — H&P (Signed)
[facility]  Patient:    [name], [name]                      MRN: [id number] Adm. Date:  [id number] Attending:  Evette Georges CC:         Corwin Levins, M.D. [facility]             Mark T. Nile Riggs, M.D.                         History and Physical  DATE OF BIRTH:  [birth year]  HISTORY OF PRESENT ILLNESS:  This is the second [facility] admission for this 77 year old semiretired black male who was admitted to the hospital for evaluation of syncope.  Patient states he was in his usual state of good health until sitting at a banquet this evening.  Before he had his meal, he got a sensation of dizziness.  He does not describe this as a spinning sensation, just a sensation of dizziness which he categorized more as "light-headedness."  He denies any chest pain, shortness of breath, or palpitations, or sensation of skipping or rapid heart rate.  Until the EMS arrived he does not remember anything else.  His wife relates that she looked at him and he said he wanted to go to his room because he felt bad.  He seemed to be staring after that and was noncommunicative.  He then got diaphoretic, but no other symptoms.  EMS came and put him on the floor, elevated his feet, and he "came to."  After that he has been perfectly normal and feels fine and indeed wants to go home. He had a spell like this in 1997, had a workup here, details of which are unknown.  Old records not available.  They did send me an EKG which is the same as his cardiogram tonight.  Other medical records again unavailable.  PAST MEDICAL HISTORY:  _________ in 1997, diagnosis unknown, same incident.  PAST ILLNESSES:  None.  INJURIES:  None.  DRUG ALLERGIES:  None.  CURRENT MEDICATIONS:  _________  one drop each eye b.i.d. for glaucoma via Dr. Nile Riggs.  HABITS:  He does not smoke or drink any alcohol.  REVIEW OF SYSTEMS:  Weight has been steady.  HEENT is negative except  for glaucoma.  CARDIOPULMONARY:  Negative.  GASTROINTESTINAL:  Negative. GENITOURINARY:  Negative.  MUSCULOSKELETAL:  Negative.  ENDOCRINE:  Negative.  FAMILY HISTORY:  His dad died at 8 of old age.  Mother died in 22s of lung cancer.  No brothers, no sisters.  SOCIAL HISTORY:  He is married, lives here in [place], [place].  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  PHYSICAL EXAMINATION:  VITAL SIGNS:  Temperature 99, pulse 70 and regular respirations 12 and regular, blood pressure 123/74.  GENERAL:  In general he is a well-developed, well-nourished, very muscular male in no acute distress.  HEENT:  Negative except for early bilateral cataracts.  NECK:  Supple, thyroid not enlarged.  No carotid bruits.  CHEST:  Clear to auscultation.  CARDIAC:  Negative.  ABDOMEN:  Negative.  GENITALIA/RECTAL:  Deferred.  He just had this checked and they were both normal, including a normal PSA.  EXTREMITIES:  Normal.  Skin normal.  Peripheral pulses normal.  NEUROLOGIC:  He is oriented x 3.  Cranial nerves 2-12 are intact and symmetrical.  Strength:  Muscle strength,  reflexes, sensation within normal limits.  Gait not tested.  LABORATORY DATA:  Shows a normal metabolic panel, CBC, arterial blood gases, and CT brain scan.  EKG shows normal sinus rhythm, occasional premature supraventricular beats. LAD, RBB, some left ventricular hypertrophy were [name] much unchanged from his previous cardiogram of 1997.  His CPK-MB total was 147 with an MB fraction of 3.5 and a troponin of 0.11.  IMPRESSION:  Syncopal episode in the upright position, question cardiac arrhythmia.  PLAN:  Admit, monitor, cardiac evaluation.  Dr. Gerri Spore was called and has kindly agreed to see him for consultation. DD:  09/30/99 TD:  10/02/99 Job: [id number]

## 2010-09-14 ENCOUNTER — Ambulatory Visit (INDEPENDENT_AMBULATORY_CARE_PROVIDER_SITE_OTHER): Payer: Medicare Other | Admitting: *Deleted

## 2010-09-14 ENCOUNTER — Other Ambulatory Visit: Payer: Self-pay | Admitting: Internal Medicine

## 2010-09-14 DIAGNOSIS — I442 Atrioventricular block, complete: Secondary | ICD-10-CM

## 2010-09-14 DIAGNOSIS — Z950 Presence of cardiac pacemaker: Secondary | ICD-10-CM

## 2010-09-16 LAB — REMOTE PACEMAKER DEVICE
AL AMPLITUDE: 2.8 mv
AL AMPLITUDE: 2.8 mv
AL IMPEDENCE PM: 435 Ohm
ATRIAL PACING PM: 17
BAMS-0001: 175 {beats}/min
BATTERY VOLTAGE: 2.79 V
BATTERY VOLTAGE: 2.79 V
BAVD-0001: 350 ms
BAVD-0002: 350 ms
BMOD-0005: 95 {beats}/min
BPAC-0002RA: 2 V
BPAC-0002RV: 3 V
BPAC-0003RA: 0.4 ms
BSEN-0002RA: 0.5 mv
BSEN-0005RA: 180 ms
BSEN-0005RV: 28 ms
DEV-0006LDO: 20010725
DEV-0006PM: 20090811
DEV-0023PM: 0
DEV-0025PM: 65
RV LEAD AMPLITUDE: 5.6 mv
RV LEAD IMPEDENCE PM: 955 Ohm
RV LEAD IMPEDENCE PM: 955 Ohm
RV LEAD THRESHOLD: 1.5 V
TCAP-0003RA: 0.4 ms
TCAP-0003RV: 0.4 ms
VENTRICULAR PACING PM: 24

## 2010-09-20 ENCOUNTER — Encounter: Payer: Self-pay | Admitting: *Deleted

## 2010-09-22 NOTE — Progress Notes (Signed)
Pacer remote check  

## 2011-01-23 ENCOUNTER — Ambulatory Visit (INDEPENDENT_AMBULATORY_CARE_PROVIDER_SITE_OTHER): Payer: Medicare Other

## 2011-01-23 DIAGNOSIS — Z23 Encounter for immunization: Secondary | ICD-10-CM

## 2011-02-20 ENCOUNTER — Encounter: Payer: Medicare Other | Admitting: Internal Medicine

## 2011-04-10 ENCOUNTER — Encounter: Payer: Self-pay | Admitting: Internal Medicine

## 2011-04-10 ENCOUNTER — Ambulatory Visit (INDEPENDENT_AMBULATORY_CARE_PROVIDER_SITE_OTHER): Payer: Medicare Other | Admitting: Internal Medicine

## 2011-04-10 DIAGNOSIS — I441 Atrioventricular block, second degree: Secondary | ICD-10-CM | POA: Insufficient documentation

## 2011-04-10 DIAGNOSIS — I442 Atrioventricular block, complete: Secondary | ICD-10-CM

## 2011-04-10 DIAGNOSIS — Z950 Presence of cardiac pacemaker: Secondary | ICD-10-CM

## 2011-04-10 LAB — PACEMAKER DEVICE OBSERVATION
AL AMPLITUDE: 4 mv
AL THRESHOLD: 0.75 V
ATRIAL PACING PM: 15
BAMS-0001: 175 {beats}/min
RV LEAD IMPEDENCE PM: 975 Ohm
RV LEAD THRESHOLD: 1 V
VENTRICULAR PACING PM: 25

## 2011-04-10 NOTE — Progress Notes (Signed)
  HPI  [name] is a 78 y.o. male seen in follow up her pacemaker implanted for high-grade heart block with subsequent generator replacement in August 2009. He is doing relatively well without complaints of chest pain. He does have some mild increases in his shortness of breath.  .    Past Medical History  Diagnosis Date  . Electromechanical dissociation (EMD) with heart block     intermittent  . Pacemaker     medtronic dual chamber  . Hepatotoxicity   . Lipoma   . DVT femoral (deep venous thrombosis) with thrombophlebitis     LUE  . Hyperlipidemia   . Hx of colonic polyps   . Erectile dysfunction   . BPH (benign prostatic hypertrophy)   . HTN (hypertension)     Past Surgical History  Procedure Date  . Pacemaker insertion 04/06/08    Medtronic    Current Outpatient Prescriptions  Medication Sig Dispense Refill  . aspirin 81 MG tablet Take 81 mg by mouth daily.      Marland Kitchen lisinopril-hydrochlorothiazide (PRINZIDE,ZESTORETIC) 20-25 MG per tablet Take 1 tablet by mouth daily.      . simvastatin (ZOCOR) 40 MG tablet Take 40 mg by mouth every evening.      . timolol (BETIMOL) 0.5 % ophthalmic solution Place 1 drop into both eyes daily.        No Known Allergies  Review of Systems negative except from HPI and PMH  Physical Exam BP 148/80  Pulse 73  Ht 5\' 10"  (1.778 m) Well developed and well nourished in no acute distress HENT normal E scleral and icterus clear Neck Supple JVP flat; carotids brisk and full Clear to ausculation regular rate and rhythm Regular rate and rhythm, no murmurs gallops or rub Soft with active bowel sounds No clubbing cyanosis none Edema Alert and oriented, grossly normal motor and sensory function Skin Warm and Dry   Assessment and  Plan

## 2011-04-10 NOTE — Patient Instructions (Signed)

## 2011-04-13 NOTE — Assessment & Plan Note (Signed)
The patient's device was interrogated.  The information was reviewed. No changes were made in the programming.    

## 2011-04-13 NOTE — Assessment & Plan Note (Signed)
stable °

## 2011-07-12 ENCOUNTER — Encounter: Payer: Medicare Other | Admitting: *Deleted

## 2011-07-17 ENCOUNTER — Encounter: Payer: Self-pay | Admitting: *Deleted

## 2011-07-24 ENCOUNTER — Encounter: Payer: Self-pay | Admitting: Internal Medicine

## 2011-07-24 ENCOUNTER — Ambulatory Visit (INDEPENDENT_AMBULATORY_CARE_PROVIDER_SITE_OTHER): Payer: Medicare Other | Admitting: *Deleted

## 2011-07-24 DIAGNOSIS — Z950 Presence of cardiac pacemaker: Secondary | ICD-10-CM

## 2011-07-24 DIAGNOSIS — I442 Atrioventricular block, complete: Secondary | ICD-10-CM

## 2011-07-30 LAB — REMOTE PACEMAKER DEVICE
AL AMPLITUDE: 2.8 mv
BAMS-0001: 175 {beats}/min
BATTERY VOLTAGE: 2.78 V
VENTRICULAR PACING PM: 29

## 2011-08-13 ENCOUNTER — Encounter: Payer: Self-pay | Admitting: *Deleted

## 2011-11-01 ENCOUNTER — Encounter: Payer: Self-pay | Admitting: Internal Medicine

## 2011-11-01 ENCOUNTER — Ambulatory Visit (INDEPENDENT_AMBULATORY_CARE_PROVIDER_SITE_OTHER): Payer: Medicare Other | Admitting: *Deleted

## 2011-11-01 DIAGNOSIS — Z950 Presence of cardiac pacemaker: Secondary | ICD-10-CM

## 2011-11-01 DIAGNOSIS — I442 Atrioventricular block, complete: Secondary | ICD-10-CM

## 2011-11-02 LAB — REMOTE PACEMAKER DEVICE
AL AMPLITUDE: 2.8 mv
AL THRESHOLD: 0.75 V
RV LEAD AMPLITUDE: 11.2 mv
RV LEAD IMPEDENCE PM: 857 Ohm
RV LEAD THRESHOLD: 1.375 V

## 2011-11-22 ENCOUNTER — Encounter: Payer: Self-pay | Admitting: *Deleted

## 2011-12-26 ENCOUNTER — Ambulatory Visit (INDEPENDENT_AMBULATORY_CARE_PROVIDER_SITE_OTHER): Payer: Medicare Other | Admitting: *Deleted

## 2011-12-26 DIAGNOSIS — Z23 Encounter for immunization: Secondary | ICD-10-CM

## 2012-02-04 ENCOUNTER — Encounter: Payer: Medicare Other | Admitting: *Deleted

## 2012-02-12 ENCOUNTER — Encounter: Payer: Self-pay | Admitting: *Deleted

## 2012-03-20 ENCOUNTER — Other Ambulatory Visit (INDEPENDENT_AMBULATORY_CARE_PROVIDER_SITE_OTHER): Payer: Medicare Other

## 2012-03-20 ENCOUNTER — Telehealth: Payer: Self-pay | Admitting: Internal Medicine

## 2012-03-20 ENCOUNTER — Encounter: Payer: Self-pay | Admitting: Internal Medicine

## 2012-03-20 ENCOUNTER — Ambulatory Visit (INDEPENDENT_AMBULATORY_CARE_PROVIDER_SITE_OTHER): Payer: Medicare Other | Admitting: Internal Medicine

## 2012-03-20 VITALS — BP 112/80 | HR 75 | Temp 97.2°F | Ht 70.0 in | Wt 218.5 lb

## 2012-03-20 DIAGNOSIS — M545 Low back pain, unspecified: Secondary | ICD-10-CM | POA: Insufficient documentation

## 2012-03-20 DIAGNOSIS — I442 Atrioventricular block, complete: Secondary | ICD-10-CM

## 2012-03-20 DIAGNOSIS — I10 Essential (primary) hypertension: Secondary | ICD-10-CM

## 2012-03-20 DIAGNOSIS — Z0000 Encounter for general adult medical examination without abnormal findings: Secondary | ICD-10-CM | POA: Insufficient documentation

## 2012-03-20 DIAGNOSIS — E785 Hyperlipidemia, unspecified: Secondary | ICD-10-CM

## 2012-03-20 LAB — CBC WITH DIFFERENTIAL/PLATELET
Basophils Relative: 0.7 % (ref 0.0–3.0)
Eosinophils Relative: 1.1 % (ref 0.0–5.0)
HCT: 37.8 % — ABNORMAL LOW (ref 39.0–52.0)
Hemoglobin: 12.6 g/dL — ABNORMAL LOW (ref 13.0–17.0)
Lymphs Abs: 1 10*3/uL (ref 0.7–4.0)
MCV: 87.4 fl (ref 78.0–100.0)
Monocytes Relative: 15.3 % — ABNORMAL HIGH (ref 3.0–12.0)
Neutro Abs: 3.1 10*3/uL (ref 1.4–7.7)
WBC: 5.1 10*3/uL (ref 4.5–10.5)

## 2012-03-20 LAB — BASIC METABOLIC PANEL
BUN: 13 mg/dL (ref 6–23)
Calcium: 9.1 mg/dL (ref 8.4–10.5)
GFR: 91.44 mL/min (ref >60.00)
Glucose, Bld: 84 mg/dL (ref 70–99)
Sodium: 134 mEq/L — ABNORMAL LOW (ref 135–145)

## 2012-03-20 LAB — URINALYSIS, ROUTINE W REFLEX MICROSCOPIC
Ketones, ur: NEGATIVE
Leukocytes, UA: NEGATIVE
Specific Gravity, Urine: <=1.005 (ref 1.000–1.030)
Total Protein, Urine: NEGATIVE
Urine Glucose: NEGATIVE
pH: 6 (ref 5.0–8.0)

## 2012-03-20 LAB — LIPID PANEL
HDL: 48.8 mg/dL (ref >39.00)
Triglycerides: 73 mg/dL (ref 0.0–149.0)
VLDL: 14.6 mg/dL (ref 0.0–40.0)

## 2012-03-20 LAB — HEPATIC FUNCTION PANEL
Albumin: 3.8 g/dL (ref 3.5–5.2)
Total Bilirubin: 0.8 mg/dL (ref 0.3–1.2)

## 2012-03-20 MED ORDER — SIMVASTATIN 40 MG PO TABS: 40.0000 mg | ORAL_TABLET | Freq: Every evening | ORAL | Status: DC

## 2012-03-20 MED ORDER — LISINOPRIL-HYDROCHLOROTHIAZIDE 20-25 MG PO TABS: 1.0000 | ORAL_TABLET | Freq: Every day | ORAL | Status: DC

## 2012-03-20 NOTE — Assessment & Plan Note (Signed)
Ok for back brace,likely underlying lumbar djd/ddd

## 2012-03-20 NOTE — Assessment & Plan Note (Signed)
stable overall by hx and exam, most recent data reviewed with pt, and pt to continue medical treatment as before BP Readings from Last 3 Encounters:  03/20/12 112/80  04/10/11 148/80  04/19/10 124/68

## 2012-03-20 NOTE — Assessment & Plan Note (Signed)
For lipids, restart statin

## 2012-03-20 NOTE — Assessment & Plan Note (Signed)
Also due for card f/u - will refer back to Dr Graciela Husbands

## 2012-03-20 NOTE — Patient Instructions (Addendum)
Your form was filled out for the back brace today Continue all other medications as before Please have the pharmacy call with any other refills you may need. Your EKG was OK today You are otherwise up to date with prevention measures today Please keep your appointments with your specialists as you have planned - Dr Graciela Husbands (the office will call) Please go to LAB in the Basement for the blood and/or urine tests to be done today You will be contacted by phone if any changes need to be made immediately.  Otherwise, you will receive a letter about your results with an explanation Please remember to sign up for My Chart at your earliest convenience, as this will be important to you in the future with finding out test results.

## 2012-03-20 NOTE — Assessment & Plan Note (Signed)

## 2012-03-20 NOTE — Progress Notes (Signed)
Subjective:    Patient ID: [name], male    DOB: Jan 13, 1926, 79 y.o.   MRN: [id number]  HPI  Here for wellness and f/u;  Overall doing ok;  Pt denies CP, worsening SOB, DOE, wheezing, orthopnea, PND, worsening LE edema, palpitations, dizziness or syncope.  Pt denies neurological change such as new Headache, facial or extremity weakness.  Pt denies polydipsia, polyuria, or low sugar symptoms. Pt states overall good compliance with treatment and medications, good tolerability, and trying to follow lower cholesterol diet.  Pt denies worsening depressive symptoms, suicidal ideation or panic. No fever, wt loss, night sweats, loss of appetite, or other constitutional symptoms.  Pt states good ability with ADL's, low fall risk, home safety reviewed and adequate, no significant changes in hearing or vision, and occasionally active with exercise   Nonsmoker, quit in 1968. Pt continues to have recurring LBP without change in severity, bowel or bladder change, fever, wt loss,  worsening LE pain/numbness/weakness, gait change or falls, does need back brace.  Due for card f/u.   Past Medical History  Diagnosis Date  . Atrioventricular block, complete     intermittent  . Pacemaker     medtronic dual chamber  . Hepatotoxicity   . Lipoma   . DVT femoral (deep venous thrombosis) with thrombophlebitis     LUE  . Hyperlipidemia   . Hx of colonic polyps   . Erectile dysfunction   . BPH (benign prostatic hypertrophy)   . HTN (hypertension)    Past Surgical History  Procedure Date  . Pacemaker insertion 04/06/08    Medtronic    reports that he has quit smoking. He does not have any smokeless tobacco history on file. He reports that he does not drink alcohol. His drug history not on file. family history includes Arthritis in an unspecified family member and Heart attack in an unspecified family member. No Known Allergies Current Outpatient Prescriptions on File Prior to Visit  Medication Sig Dispense  Refill  . aspirin 81 MG tablet Take 81 mg by mouth daily.      . timolol (BETIMOL) 0.5 % ophthalmic solution Place 1 drop into both eyes daily.      Marland Kitchen lisinopril-hydrochlorothiazide (PRINZIDE,ZESTORETIC) 20-25 MG per tablet Take 1 tablet by mouth daily.  90 tablet  3  . simvastatin (ZOCOR) 40 MG tablet Take 1 tablet (40 mg total) by mouth every evening.  90 tablet  3   Review of Systems Review of Systems  Constitutional: Negative for diaphoresis, activity change, appetite change and unexpected weight change.  HENT: Negative for hearing loss, ear pain, facial swelling, mouth sores and neck stiffness.   Eyes: Negative for pain, redness and visual disturbance.  Respiratory: Negative for shortness of breath and wheezing.   Cardiovascular: Negative for chest pain and palpitations.  Gastrointestinal: Negative for diarrhea, blood in stool, abdominal distention and rectal pain.  Genitourinary: Negative for hematuria, flank pain and decreased urine volume.  Musculoskeletal: Negative for myalgias and joint swelling.  Skin: Negative for color change and wound.  Neurological: Negative for syncope and numbness.  Hematological: Negative for adenopathy.  Psychiatric/Behavioral: Negative for hallucinations, self-injury, decreased concentration and agitation.      Objective:   Physical Exam BP 112/80  Pulse 75  Temp 97.2 F (36.2 C) (Oral)  Ht 5\' 10"  (1.778 m)  Wt 218 lb 8 oz (99.111 kg)  BMI 31.35 kg/m2  SpO2 98% Physical Exam  VS noted Constitutional: Pt is oriented to person, place, and time.  Appears well-developed and well-nourished./obese  HENT:  Head: Normocephalic and atraumatic.  Right Ear: External ear normal.  Left Ear: External ear normal.  Nose: Nose normal.  Mouth/Throat: Oropharynx is clear and moist.  Eyes: Conjunctivae and EOM are normal. Pupils are equal, round, and reactive to light.  Neck: Normal range of motion. Neck supple. No JVD present. No tracheal deviation present.    Cardiovascular: Normal rate, regular rhythm, normal heart sounds and intact distal pulses.   Pulmonary/Chest: Effort normal and breath sounds normal.  Abdominal: Soft. Bowel sounds are normal. There is no tenderness.  Musculoskeletal: Normal range of motion. Exhibits no edema.  Lymphadenopathy:  Has no cervical adenopathy.  Neurological: Pt is alert and oriented to person, place, and time. Pt has normal reflexes. No cranial nerve deficit.  Skin: Skin is warm and dry. No rash noted.  Psychiatric:  Has  normal mood and affect. Behavior is normal.     Assessment & Plan:

## 2012-03-20 NOTE — Telephone Encounter (Signed)
Pt seen at [facility] today  Appears not taking the statin according to elev LDL  Robin to contact pt;  New rx for BP med and Zocor done hardcopy as no pharmacy on record - pt needs to take

## 2012-03-21 ENCOUNTER — Telehealth: Payer: Self-pay | Admitting: Internal Medicine

## 2012-03-21 MED ORDER — ATORVASTATIN CALCIUM 40 MG PO TABS: 40.0000 mg | ORAL_TABLET | Freq: Every day | ORAL | Status: AC

## 2012-03-21 NOTE — Telephone Encounter (Signed)
Please advise as the patient is already taking simvastatin every day.

## 2012-03-21 NOTE — Telephone Encounter (Signed)
Pt states good compliacne with zocor but LDL still elevated  OK to change to Lipitor 40 mg for hopefully better control  Done hardcopy to robin  (no pharmacy on record)

## 2012-03-21 NOTE — Telephone Encounter (Signed)
Patient informed and faxed hardcopy to [facility] per request.

## 2012-04-11 ENCOUNTER — Encounter: Payer: Medicare Other | Admitting: Internal Medicine

## 2012-06-11 ENCOUNTER — Ambulatory Visit (INDEPENDENT_AMBULATORY_CARE_PROVIDER_SITE_OTHER): Payer: Medicare Other | Admitting: Internal Medicine

## 2012-06-11 ENCOUNTER — Encounter: Payer: Self-pay | Admitting: Internal Medicine

## 2012-06-11 VITALS — BP 139/71 | HR 84 | Ht 70.0 in | Wt 208.4 lb

## 2012-06-11 DIAGNOSIS — I4891 Unspecified atrial fibrillation: Secondary | ICD-10-CM

## 2012-06-11 DIAGNOSIS — I442 Atrioventricular block, complete: Secondary | ICD-10-CM

## 2012-06-11 LAB — PACEMAKER DEVICE OBSERVATION
AL IMPEDENCE PM: 435 Ohm
ATRIAL PACING PM: 13
BAMS-0001: 175 {beats}/min
BATTERY VOLTAGE: 2.78 V
VENTRICULAR PACING PM: 32

## 2012-06-11 NOTE — Progress Notes (Signed)
Patient Care Team: Corwin Levins, MD as PCP - General   HPI  [name] is a 79 y.o. male seen in follow up her pacemaker implanted for high-grade heart block with subsequent generator replacement in August 2009. He is doing relatively well without complaints of chest pain.   He denies sob and edema    Past Medical History  Diagnosis Date  . Atrioventricular block, complete     intermittent  . Pacemaker     medtronic dual chamber  . Hepatotoxicity   . Lipoma   . DVT femoral (deep venous thrombosis) with thrombophlebitis     LUE  . Hyperlipidemia   . Hx of colonic polyps   . Erectile dysfunction   . BPH (benign prostatic hypertrophy)   . HTN (hypertension)     Past Surgical History  Procedure Laterality Date  . Pacemaker insertion  04/06/08    Medtronic    Current Outpatient Prescriptions  Medication Sig Dispense Refill  . aspirin 81 MG tablet Take 81 mg by mouth daily.      Marland Kitchen atorvastatin (LIPITOR) 40 MG tablet Take 1 tablet (40 mg total) by mouth daily.  90 tablet  3  . lisinopril-hydrochlorothiazide (PRINZIDE,ZESTORETIC) 20-25 MG per tablet Take 1 tablet by mouth daily.  90 tablet  3  . timolol (BETIMOL) 0.5 % ophthalmic solution Place 1 drop into both eyes daily.       No current facility-administered medications for this visit.    No Known Allergies  Review of Systems negative except from HPI and PMH  Physical Exam BP 139/71  Pulse 84  Ht 5\' 10"  (1.778 m)  Wt 208 lb 6.4 oz (94.53 kg)  BMI 29.9 kg/m2  Well developed and nourished in no acute distress HENT normal Neck supple with JVP-flat Clear Regular rate and rhythm, no murmurs or gallops Abd-soft with active BS No Clubbing cyanosis edema Skin-warm and dry A & Oriented  Grossly normal sensory and motor function    Assessment and  Plan

## 2012-06-11 NOTE — Assessment & Plan Note (Signed)
Stable at 30% ventricular pacing

## 2012-06-11 NOTE — Assessment & Plan Note (Signed)
he had a five-minute episode of nonsustained atrial fibrillation. His CHADS-VASc score is 2; we will follow this carefully. In the event that he has a longer duration episode we will consider anticoagulation

## 2012-06-11 NOTE — Patient Instructions (Addendum)
Remote monitoring is used to monitor your Pacemaker of ICD from home. This monitoring reduces the number of office visits required to check your device to one time per year. It allows [place] to keep an eye on the functioning of your device to ensure it is working properly. You are scheduled for a device check from home on 09/16/12. You may send your transmission at any time that day. If you have a wireless device, the transmission will be sent automatically. After your physician reviews your transmission, you will receive a postcard with your next transmission date.  Your physician wants you to follow-up in: 1 year.   You will receive a reminder letter in the mail two months in advance. If you don't receive a letter, please call our office to schedule the follow-up appointment.

## 2012-09-15 ENCOUNTER — Ambulatory Visit (INDEPENDENT_AMBULATORY_CARE_PROVIDER_SITE_OTHER): Payer: Medicare Other | Admitting: *Deleted

## 2012-09-15 DIAGNOSIS — Z950 Presence of cardiac pacemaker: Secondary | ICD-10-CM

## 2012-09-15 DIAGNOSIS — I441 Atrioventricular block, second degree: Secondary | ICD-10-CM

## 2012-09-16 ENCOUNTER — Encounter: Payer: Self-pay | Admitting: Internal Medicine

## 2012-09-17 LAB — REMOTE PACEMAKER DEVICE
AL AMPLITUDE: 2.8 mv
BAMS-0001: 175 {beats}/min
BATTERY VOLTAGE: 2.77 V
RV LEAD AMPLITUDE: 8 mv
RV LEAD IMPEDENCE PM: 966 Ohm
RV LEAD THRESHOLD: 1.125 V

## 2012-09-29 ENCOUNTER — Observation Stay (HOSPITAL_COMMUNITY)
Admission: EM | Admit: 2012-09-29 | Discharge: 2012-10-01 | Disposition: A | Discharge status: Home / Self Care | Payer: Medicare Other | Attending: Cardiology | Admitting: Cardiology

## 2012-09-29 ENCOUNTER — Encounter (HOSPITAL_COMMUNITY): Payer: Self-pay | Admitting: Emergency Medicine

## 2012-09-29 ENCOUNTER — Emergency Department (HOSPITAL_COMMUNITY): Payer: Medicare Other

## 2012-09-29 DIAGNOSIS — R0609 Other forms of dyspnea: Secondary | ICD-10-CM | POA: Insufficient documentation

## 2012-09-29 DIAGNOSIS — R079 Chest pain, unspecified: Secondary | ICD-10-CM | POA: Insufficient documentation

## 2012-09-29 DIAGNOSIS — R011 Cardiac murmur, unspecified: Secondary | ICD-10-CM | POA: Insufficient documentation

## 2012-09-29 DIAGNOSIS — I200 Unstable angina: Secondary | ICD-10-CM

## 2012-09-29 DIAGNOSIS — N400 Enlarged prostate without lower urinary tract symptoms: Secondary | ICD-10-CM | POA: Diagnosis present

## 2012-09-29 DIAGNOSIS — D696 Thrombocytopenia, unspecified: Secondary | ICD-10-CM

## 2012-09-29 DIAGNOSIS — Z950 Presence of cardiac pacemaker: Secondary | ICD-10-CM | POA: Insufficient documentation

## 2012-09-29 DIAGNOSIS — R0989 Other specified symptoms and signs involving the circulatory and respiratory systems: Secondary | ICD-10-CM | POA: Insufficient documentation

## 2012-09-29 DIAGNOSIS — E785 Hyperlipidemia, unspecified: Secondary | ICD-10-CM | POA: Diagnosis present

## 2012-09-29 DIAGNOSIS — I10 Essential (primary) hypertension: Secondary | ICD-10-CM | POA: Insufficient documentation

## 2012-09-29 HISTORY — DX: Chest pain, unspecified: R07.9

## 2012-09-29 LAB — CBC WITH DIFFERENTIAL/PLATELET
Hemoglobin: 12.6 g/dL — ABNORMAL LOW (ref 13.0–17.0)
Lymphocytes Relative: 33 % (ref 12–46)
Lymphs Abs: 1.5 10*3/uL (ref 0.7–4.0)
MCV: 85.7 fL (ref 78.0–100.0)
Monocytes Relative: 15 % — ABNORMAL HIGH (ref 3–12)
Neutrophils Relative %: 50 % (ref 43–77)
Platelets: 94 10*3/uL — ABNORMAL LOW (ref 150–400)
RBC: 4.28 MIL/uL (ref 4.22–5.81)
WBC: 4.5 10*3/uL (ref 4.0–10.5)

## 2012-09-29 LAB — PROTIME-INR
INR: 0.99 (ref 0.00–1.49)
INR: 1.05 (ref 0.00–1.49)
Prothrombin Time: 12.9 seconds (ref 11.6–15.2)

## 2012-09-29 LAB — BASIC METABOLIC PANEL
CO2: 25 mEq/L (ref 19–32)
Glucose, Bld: 73 mg/dL (ref 70–99)
Potassium: 3.4 mEq/L — ABNORMAL LOW (ref 3.5–5.1)
Sodium: 134 mEq/L — ABNORMAL LOW (ref 135–145)

## 2012-09-29 LAB — POCT I-STAT TROPONIN I

## 2012-09-29 LAB — MAGNESIUM: Magnesium: 1.9 mg/dL (ref 1.5–2.5)

## 2012-09-29 LAB — APTT: aPTT: 30 seconds (ref 24–37)

## 2012-09-29 LAB — TROPONIN I: Troponin I: <0.3 ng/mL (ref <0.30)

## 2012-09-29 MED ORDER — ASPIRIN 81 MG PO TABS: 81.0000 mg | ORAL_TABLET | Freq: Every day | ORAL | Status: DC

## 2012-09-29 MED ORDER — ASPIRIN EC 81 MG PO TBEC
81.0000 mg | DELAYED_RELEASE_TABLET | Freq: Every day | ORAL | Status: DC
  Administered 2012-09-30 – 2012-10-01 (×2): 81 mg via ORAL
  Filled 2012-09-29 (×2): qty 1

## 2012-09-29 MED ORDER — ATORVASTATIN CALCIUM 40 MG PO TABS
40.0000 mg | ORAL_TABLET | Freq: Every day | ORAL | Status: DC
  Administered 2012-09-30 – 2012-10-01 (×2): 40 mg via ORAL
  Filled 2012-09-29 (×2): qty 1

## 2012-09-29 MED ORDER — HEPARIN (PORCINE) IN NACL 100-0.45 UNIT/ML-% IJ SOLN
800.0000 [IU]/h | INTRAMUSCULAR | Status: DC
  Administered 2012-09-29: 1100 [IU]/h via INTRAVENOUS
  Administered 2012-09-30: 850 [IU]/h via INTRAVENOUS
  Administered 2012-09-30: 1000 [IU]/h via INTRAVENOUS
  Filled 2012-09-29 (×4): qty 250

## 2012-09-29 MED ORDER — SODIUM CHLORIDE 0.9 % IV SOLN: 250.0000 mL | INTRAVENOUS | Status: DC | PRN

## 2012-09-29 MED ORDER — NITROGLYCERIN 0.4 MG SL SUBL: 0.4000 mg | SUBLINGUAL_TABLET | SUBLINGUAL | Status: DC | PRN

## 2012-09-29 MED ORDER — ONDANSETRON HCL 4 MG/2ML IJ SOLN: 4.0000 mg | Freq: Four times a day (QID) | INTRAMUSCULAR | Status: DC | PRN

## 2012-09-29 MED ORDER — SODIUM CHLORIDE 0.9 % IJ SOLN
3.0000 mL | Freq: Two times a day (BID) | INTRAMUSCULAR | Status: DC
  Administered 2012-10-01: 3 mL via INTRAVENOUS

## 2012-09-29 MED ORDER — TIMOLOL HEMIHYDRATE 0.5 % OP SOLN: 1.0000 [drp] | Freq: Every day | OPHTHALMIC | Status: DC

## 2012-09-29 MED ORDER — ASPIRIN 325 MG PO TABS
325.0000 mg | ORAL_TABLET | Freq: Once | ORAL | Status: AC
  Administered 2012-09-29: 325 mg via ORAL
  Filled 2012-09-29: qty 1

## 2012-09-29 MED ORDER — LISINOPRIL-HYDROCHLOROTHIAZIDE 20-25 MG PO TABS: 1.0000 | ORAL_TABLET | Freq: Every day | ORAL | Status: DC

## 2012-09-29 MED ORDER — NITROGLYCERIN 0.4 MG SL SUBL
0.4000 mg | SUBLINGUAL_TABLET | SUBLINGUAL | Status: DC | PRN
  Administered 2012-09-29: 0.4 mg via SUBLINGUAL
  Filled 2012-09-29: qty 25

## 2012-09-29 MED ORDER — LISINOPRIL 20 MG PO TABS
20.0000 mg | ORAL_TABLET | Freq: Every day | ORAL | Status: DC
  Administered 2012-09-30 – 2012-10-01 (×2): 20 mg via ORAL
  Filled 2012-09-29 (×2): qty 1

## 2012-09-29 MED ORDER — TIMOLOL MALEATE 0.5 % OP SOLN
1.0000 [drp] | Freq: Every day | OPHTHALMIC | Status: DC
  Administered 2012-09-30 – 2012-10-01 (×2): 1 [drp] via OPHTHALMIC
  Filled 2012-09-29: qty 5

## 2012-09-29 MED ORDER — HEPARIN BOLUS VIA INFUSION
4000.0000 [IU] | Freq: Once | INTRAVENOUS | Status: AC
  Administered 2012-09-29: 4000 [IU] via INTRAVENOUS

## 2012-09-29 MED ORDER — ACETAMINOPHEN 325 MG PO TABS: 650.0000 mg | ORAL_TABLET | ORAL | Status: DC | PRN

## 2012-09-29 MED ORDER — SODIUM CHLORIDE 0.9 % IJ SOLN: 3.0000 mL | INTRAMUSCULAR | Status: DC | PRN

## 2012-09-29 MED ORDER — HYDROCHLOROTHIAZIDE 25 MG PO TABS
25.0000 mg | ORAL_TABLET | Freq: Every day | ORAL | Status: DC
  Administered 2012-09-30 – 2012-10-01 (×2): 25 mg via ORAL
  Filled 2012-09-29 (×2): qty 1

## 2012-09-29 NOTE — ED Notes (Signed)
Attempted to call report to floor, nurse unable to take report at this time, call back info left with unit secretary

## 2012-09-29 NOTE — ED Provider Notes (Addendum)
History    CSN: [id number] Arrival date & time 09/29/12  1726  First MD Initiated Contact with Patient 09/29/12 1734     Chief Complaint  Patient presents with  . Chest Pain   (Consider location/radiation/quality/duration/timing/severity/associated sxs/prior Treatment) The history is provided by the patient.  [name] is a 79 y.o. male hx of complete heart block s/p pacemaker, HL, L upper extremity DVT not on coumadin here with chest pain. Substernal chest pain this AM, felt like "elephant sitting on his chest". He took ASA 81 mg and felt slightly better. Denies radiation from the pain. Some shortness of breath but no nausea or vomiting. No history of heart failure.   Past Medical History  Diagnosis Date  . Atrioventricular block, complete     intermittent  . Pacemaker     medtronic dual chamber  . Hepatotoxicity   . Lipoma   . DVT femoral (deep venous thrombosis) with thrombophlebitis     LUE  . Hyperlipidemia   . Hx of colonic polyps   . Erectile dysfunction   . BPH (benign prostatic hypertrophy)   . HTN (hypertension)    Past Surgical History  Procedure Laterality Date  . Pacemaker insertion  04/06/08    Medtronic   Family History  Problem Relation Age of Onset  . Arthritis    . Heart attack      uncle in his 22's   History  Substance Use Topics  . Smoking status: Former Games developer  . Smokeless tobacco: Not on file  . Alcohol Use: No    Review of Systems  Cardiovascular: Positive for chest pain.  All other systems reviewed and are negative.    Allergies  Review of patient's allergies indicates no known allergies.  Home Medications   Current Outpatient Rx  Name  Route  Sig  Dispense  Refill  . aspirin 81 MG tablet   Oral   Take 81 mg by mouth daily.         Marland Kitchen atorvastatin (LIPITOR) 40 MG tablet   Oral   Take 1 tablet (40 mg total) by mouth daily.   90 tablet   3   . lisinopril-hydrochlorothiazide (PRINZIDE,ZESTORETIC) 20-25 MG per  tablet   Oral   Take 1 tablet by mouth daily.   90 tablet   3   . timolol (BETIMOL) 0.5 % ophthalmic solution   Both Eyes   Place 1 drop into both eyes daily.          BP 158/88  Pulse 66  Temp(Src) 97.5 F (36.4 C) (Oral)  Resp 16  Ht 5\' 10"  (1.778 m)  Wt 206 lb (93.441 kg)  BMI 29.56 kg/m2  SpO2 100% Physical Exam  Nursing note and vitals reviewed. Constitutional: He is oriented to person, place, and time. He appears well-developed and well-nourished.  HENT:  Head: Normocephalic.  Mouth/Throat: Oropharynx is clear and moist.  Eyes: Conjunctivae are normal. Pupils are equal, round, and reactive to light.  Neck: Normal range of motion. Neck supple.  Cardiovascular: Normal rate, regular rhythm and normal heart sounds.   Pulmonary/Chest: Effort normal and breath sounds normal. No respiratory distress. He has no wheezes. He has no rales.  Not reproducible   Abdominal: Soft. Bowel sounds are normal. He exhibits no distension. There is no tenderness. There is no rebound.  Musculoskeletal: Normal range of motion.  Neurological: He is alert and oriented to person, place, and time.  Skin: Skin is warm and dry.  Psychiatric: He has  a normal mood and affect. His behavior is normal. Judgment and thought content normal.    ED Course  Procedures (including critical care time)  CRITICAL CARE Performed by: Silverio Lay, Mauro Arps   Total critical care time: 30 min   Critical care time was exclusive of separately billable procedures and treating other patients.  Critical care was necessary to treat or prevent imminent or life-threatening deterioration.  Critical care was time spent personally by me on the following activities: development of treatment plan with patient and/or surrogate as well as nursing, discussions with consultants, evaluation of patient's response to treatment, examination of patient, obtaining history from patient or surrogate, ordering and performing treatments and  interventions, ordering and review of laboratory studies, ordering and review of radiographic studies, pulse oximetry and re-evaluation of patient's condition.   Labs Reviewed  CBC WITH DIFFERENTIAL - Abnormal; Notable for the following:    Hemoglobin 12.6 (*)    HCT 36.7 (*)    Monocytes Relative 15 (*)    All other components within normal limits  BASIC METABOLIC PANEL - Abnormal; Notable for the following:    Sodium 134 (*)    Potassium 3.4 (*)    GFR calc non Af Amer 51 (*)    GFR calc Af Amer 59 (*)    All other components within normal limits  APTT  PROTIME-INR  POCT I-STAT TROPONIN I   Dg Chest 2 View  09/29/2012   *RADIOLOGY REPORT*  Clinical Data: Chest pain and tightness.  Hypertension.  Nonsmoker.  CHEST - 2 VIEW  Comparison: None.  Findings: There is elevation of the right hemidiaphragm.  Heart size is probably normal.  The patient has a left-sided transvenous pacemaker with leads to the right atrium and right ventricle. Lungs are clear.  IMPRESSION: No evidence for acute cardiopulmonary  abnormality. Elevated right hemidiaphragm.   Original Report Authenticated By: Norva Pavlov, M.D.   1. Unstable angina      Date: 09/29/2012  Rate: 54  Rhythm: normal sinus rhythm  QRS Axis: normal  Intervals: normal  ST/T Wave abnormalities: nonspecific ST changes  Conduction Disutrbances:right bundle branch block and left anterior fascicular block  Narrative Interpretation: + ventricular bigeminy  Old EKG Reviewed: unchanged    MDM  [name] is a 79 y.o. male here with chest pain. Concerning for unstable angina. Will get labs and cxr. Will likely need admission.   6:41 PM I called Dr. Myrtis Ser from [facility] cardiology. Trop neg x 1. Recommend heparin drip and transfer to [facility] for unstable angina. Pain free after asa and nitro.    Richardean Canal, MD 09/29/12 Paulo Fruit  Richardean Canal, MD 09/29/12 [phone]

## 2012-09-29 NOTE — ED Notes (Signed)
Pt here for c/o chest pain this am" like an elephant sitting on her chest " some sob denies n/v no sweating

## 2012-09-29 NOTE — H&P (Signed)
[name] is an 79 y.o. male.   Chief Complaint: chest pain Primary Cardiologist: Katz/EP Graciela Husbands HPI: [name] is an 50 yo man with PMH of HTN, dyslipidemia, high degree AV block s/p dual chamber PM 2009 followed by Dr. Graciela Husbands, prior LUE DVT who had substernal CP this morning that felt heavy and pressure-like, "like an elephant sitting on my chest" ultimately leading to presentation. He told me the pain lasted the better part of the day and it wasn't like something he has had before. He thought maybe it could be indigestion because he ate quite a bit the day before but he doesn't get indigestion very often either. He had no nausea/vomiting/diarrhea. No recent travel, no fever/chills. He does have some mild associated SOB/DOE. He can walk 1-2 blocks if he rests, no PND, no orthopnea (1 pillow). In the ER he received aspirin 324 mg PO x1, IV heparin 4000 units and 1100 units/hr drip initiated in ER at [facility] and transfer to [facility].   Past Medical History  Diagnosis Date  . Atrioventricular block, complete     intermittent  . Pacemaker     medtronic dual chamber  . Hepatotoxicity   . Lipoma   . DVT femoral (deep venous thrombosis) with thrombophlebitis     LUE  . Hyperlipidemia   . Hx of colonic polyps   . Erectile dysfunction   . BPH (benign prostatic hypertrophy)   . HTN (hypertension)     Past Surgical History  Procedure Laterality Date  . Pacemaker insertion  04/06/08    Medtronic    Family History  Problem Relation Age of Onset  . Arthritis    . Heart attack      uncle in his 69's   Social History:  reports that he has quit smoking. He does not have any smokeless tobacco history on file. He reports that he does not drink alcohol. His drug history is not on file. Quit tobacco 1968 Allergies: No Known Allergies  Medications Prior to Admission  Medication Sig Dispense Refill  . aspirin 81 MG tablet Take 81 mg by mouth daily.      Marland Kitchen atorvastatin  (LIPITOR) 40 MG tablet Take 1 tablet (40 mg total) by mouth daily.  90 tablet  3  . lisinopril-hydrochlorothiazide (PRINZIDE,ZESTORETIC) 20-25 MG per tablet Take 1 tablet by mouth daily.  90 tablet  3  . timolol (BETIMOL) 0.5 % ophthalmic solution Place 1 drop into both eyes daily.        Results for orders placed during the hospital encounter of 09/29/12 (from the past 48 hour(s))  CBC WITH DIFFERENTIAL     Status: Abnormal   Collection Time    09/29/12  6:00 PM      Result Value Range   WBC 4.5  4.0 - 10.5 K/uL   RBC 4.28  4.22 - 5.81 MIL/uL   Hemoglobin 12.6 (*) 13.0 - 17.0 g/dL   HCT 32.4 (*) 40.1 - 02.7 %   MCV 85.7  78.0 - 100.0 fL   MCH 29.4  26.0 - 34.0 pg   MCHC 34.3  30.0 - 36.0 g/dL   RDW 25.3  66.4 - 40.3 %   Platelets 94 (*) 150 - 400 K/uL   Comment: REPEATED TO VERIFY     SPECIMEN CHECKED FOR CLOTS     PLATELET COUNT CONFIRMED BY SMEAR   Neutrophils Relative % 50  43 - 77 %   Neutro Abs 2.3  1.7 - 7.7 K/uL  Lymphocytes Relative 33  12 - 46 %   Lymphs Abs 1.5  0.7 - 4.0 K/uL   Monocytes Relative 15 (*) 3 - 12 %   Monocytes Absolute 0.7  0.1 - 1.0 K/uL   Eosinophils Relative 2  0 - 5 %   Eosinophils Absolute 0.1  0.0 - 0.7 K/uL   Basophils Relative 0  0 - 1 %   Basophils Absolute 0.0  0.0 - 0.1 K/uL  BASIC METABOLIC PANEL     Status: Abnormal   Collection Time    09/29/12  6:00 PM      Result Value Range   Sodium 134 (*) 135 - 145 mEq/L   Potassium 3.4 (*) 3.5 - 5.1 mEq/L   Chloride 99  96 - 112 mEq/L   CO2 25  19 - 32 mEq/L   Glucose, Bld 73  70 - 99 mg/dL   BUN 20  6 - 23 mg/dL   Creatinine, Ser 8.11  0.50 - 1.35 mg/dL   Calcium 9.2  8.4 - 91.4 mg/dL   GFR calc non Af Amer 51 (*) >90 mL/min   GFR calc Af Amer 59 (*) >90 mL/min   Comment:            The eGFR has been calculated     using the CKD EPI equation.     This calculation has not been     validated in all clinical     situations.     eGFR's persistently     <90 mL/min signify     possible  Chronic Kidney Disease.  APTT     Status: None   Collection Time    09/29/12  6:00 PM      Result Value Range   aPTT 30  24 - 37 seconds  PROTIME-INR     Status: None   Collection Time    09/29/12  6:00 PM      Result Value Range   Prothrombin Time 12.9  11.6 - 15.2 seconds   INR 0.99  0.00 - 1.49  POCT I-STAT TROPONIN I     Status: None   Collection Time    09/29/12  6:07 PM      Result Value Range   Troponin i, poc 0.01  0.00 - 0.08 ng/mL   Comment 3            Comment: Due to the release kinetics of cTnI,     a negative result within the first hours     of the onset of symptoms does not rule out     myocardial infarction with certainty.     If myocardial infarction is still suspected,     repeat the test at appropriate intervals.  PROTIME-INR     Status: None   Collection Time    09/29/12 10:30 PM      Result Value Range   Prothrombin Time 13.5  11.6 - 15.2 seconds   INR 1.05  0.00 - 1.49   Dg Chest 2 View  09/29/2012   *RADIOLOGY REPORT*  Clinical Data: Chest pain and tightness.  Hypertension.  Nonsmoker.  CHEST - 2 VIEW  Comparison: None.  Findings: There is elevation of the right hemidiaphragm.  Heart size is probably normal.  The patient has a left-sided transvenous pacemaker with leads to the right atrium and right ventricle. Lungs are clear.  IMPRESSION: No evidence for acute cardiopulmonary  abnormality. Elevated right hemidiaphragm.   Original Report Authenticated By: Lanora Manis  Manson Passey, M.D.    Review of Systems  Constitutional: Negative for fever, chills and malaise/fatigue.  HENT: Negative for hearing loss, ear pain and neck pain.   Eyes: Negative for double vision, photophobia and pain.  Respiratory: Positive for shortness of breath. Negative for cough and hemoptysis.   Cardiovascular: Positive for chest pain. Negative for palpitations, orthopnea and leg swelling.  Gastrointestinal: Positive for heartburn. Negative for nausea, vomiting, abdominal pain, blood in  stool and melena.  Genitourinary: Negative for dysuria and hematuria.  Musculoskeletal: Negative for myalgias.  Skin: Negative for itching and rash.  Neurological: Negative for dizziness, tingling, tremors and headaches.  Endo/Heme/Allergies: Negative for polydipsia. Does not bruise/bleed easily.  Psychiatric/Behavioral: Negative for depression, suicidal ideas and substance abuse.    Blood pressure 163/84, pulse 85, temperature 97.2 F (36.2 C), temperature source Oral, resp. rate 17, height 5\' 10"  (1.778 m), weight 91.7 kg (202 lb 2.6 oz), SpO2 100.00%. Physical Exam  Nursing note and vitals reviewed. Constitutional: He is oriented to person, place, and time. He appears well-developed and well-nourished. No distress.  HENT:  Head: Normocephalic and atraumatic.  Nose: Nose normal.  Mouth/Throat: Oropharynx is clear and moist. No oropharyngeal exudate.  Eyes: Conjunctivae and EOM are normal. Pupils are equal, round, and reactive to light. No scleral icterus.  Neck: Normal range of motion. Neck supple. No JVD present. No tracheal deviation present. No thyromegaly present.  Cardiovascular: Normal rate, regular rhythm and intact distal pulses.  Exam reveals no gallop and no friction rub.   Murmur heard. Systolic murmur noted at LSB  Respiratory: Effort normal and breath sounds normal. No respiratory distress. He has no wheezes. He has no rales.  GI: Soft. Bowel sounds are normal. He exhibits no distension. There is no tenderness. There is no rebound.  Musculoskeletal: Normal range of motion. He exhibits no edema and no tenderness.  Neurological: He is alert and oriented to person, place, and time. No cranial nerve deficit. Coordination normal.  Skin: Skin is warm and dry. No rash noted. He is not diaphoretic. No erythema.  Psychiatric: He has a normal mood and affect. His behavior is normal.     Labs reviewed; wbc 4.5, h/h 12.6/36.7, plt 94, na 134, K 3.4, bun/cr 20/1.24, cow 25, troponin  OC 0.01 Chest x-ray: dual chamber PM noted, elevated right hemidiaphragm Ecg: bigeminy noted, RBBB/LAFB/1st degree AV block  Problem List Chest Pain/Unstable Angina Prior high degree AV block s/p dual chamber PM Dyslipidemia Hypertension Thrombocytopenia Assessment/Plan 79 yo man with PMH of high degree AV block s/p dual chamber PPM 8/09, hypertension, dyslipidemia here with chest pain concerning for unstable angina. Differential diagnosis includes musculoskeletal, GERD, esophageal spasm, coronary artery disease among other etiologies. In think he is intermediate risk given age, remote tobacco, mild family history. It is difficult to assess his exercise capacity. He had heparin initiated in ER which also sways my thought-processing. I plan on trending enyzmes overnight and reassessing symptoms. We discussed cardiac catheterization and the possibility we would pursue this strategy based on his cardiac biomarkers and further symptoms. I think stress testing might also be reasonable. - Aspirin 324 mg given in ER, heparin gtt initiated.  - place on telemetry, observation overnight with cardiac biomarker trending - atorvastatin 40 mg qHS, first dose now - tsh, bnp, hba1c, lipid panel in AM, Mg - NPO after midnight for possible LHC; stress testing vs. LHC in AM based on any further symptoms and cardiac biomarkers - will need to watch platelets, repeat CBC  early AM - on hctz/lisinopril for HTN  Liticia Gasior 09/29/2012, 11:39 PM

## 2012-09-29 NOTE — Progress Notes (Signed)
ANTICOAGULATION CONSULT NOTE - Initial Consult  Pharmacy Consult for Heparin Indication: chest pain/ACS  No Known Allergies  Patient Measurements: Height: 5\' 10"  (177.8 cm) Weight: 206 lb (93.441 kg) IBW/kg (Calculated) : 73 Heparin Dosing Weight: 92 kg  Vital Signs: Temp: 97.5 F (36.4 C) (07/21 1732) Temp src: Oral (07/21 1732) BP: 158/88 mmHg (07/21 1732) Pulse Rate: 66 (07/21 1732)  Labs:  Recent Labs  09/29/12 1800  HGB 12.6*  HCT 36.7*  PLT PENDING  CREATININE 1.24    Estimated Creatinine Clearance: 50 ml/min (by C-G formula based on Cr of 1.24).   Medical History: Past Medical History  Diagnosis Date  . Atrioventricular block, complete     intermittent  . Pacemaker     medtronic dual chamber  . Hepatotoxicity   . Lipoma   . DVT femoral (deep venous thrombosis) with thrombophlebitis     LUE  . Hyperlipidemia   . Hx of colonic polyps   . Erectile dysfunction   . BPH (benign prostatic hypertrophy)   . HTN (hypertension)     Assessment: 69 yoM with hx of complete heart block s/p pacemaker, HL, L upper extremity DVT not on coumadin.  Presents with chest pain to begin heparin for unstable angina.  Troponins negative x 1.  Planned transfer to [facility].   Goal of Therapy:  Heparin level 0.3-0.7 units/ml Monitor platelets by anticoagulation protocol: Yes   Plan:   Heparin 4000 units IV bolus x 1 then start heparin infusion at 1100 units/hr (11 mL/hr).    Heparin level 6 hours following start of heparin (~0100 7/22).  Daily heparin level and CBC while on heparin.  Clance Boll 09/29/2012,6:42 PM

## 2012-09-30 ENCOUNTER — Observation Stay (HOSPITAL_COMMUNITY): Payer: Medicare Other

## 2012-09-30 DIAGNOSIS — I200 Unstable angina: Principal | ICD-10-CM

## 2012-09-30 DIAGNOSIS — R079 Chest pain, unspecified: Secondary | ICD-10-CM

## 2012-09-30 LAB — BASIC METABOLIC PANEL
CO2: 28 mEq/L (ref 19–32)
Calcium: 8.8 mg/dL (ref 8.4–10.5)
Creatinine, Ser: 1.08 mg/dL (ref 0.50–1.35)
Glucose, Bld: 76 mg/dL (ref 70–99)

## 2012-09-30 LAB — HEPARIN LEVEL (UNFRACTIONATED)
Heparin Unfractionated: 0.75 IU/mL — ABNORMAL HIGH (ref 0.30–0.70)
Heparin Unfractionated: 0.73 IU/mL — ABNORMAL HIGH (ref 0.30–0.70)

## 2012-09-30 LAB — HEMOGLOBIN A1C: Mean Plasma Glucose: 111 mg/dL (ref <117)

## 2012-09-30 LAB — TROPONIN I: Troponin I: <0.3 ng/mL (ref <0.30)

## 2012-09-30 LAB — CBC
Hemoglobin: 12.7 g/dL — ABNORMAL LOW (ref 13.0–17.0)
MCH: 30.2 pg (ref 26.0–34.0)
MCHC: 35.9 g/dL (ref 30.0–36.0)
RDW: 13.6 % (ref 11.5–15.5)

## 2012-09-30 LAB — LIPID PANEL
Cholesterol: 157 mg/dL (ref 0–200)
HDL: 51 mg/dL (ref >39)
Triglycerides: 56 mg/dL (ref <150)

## 2012-09-30 MED ORDER — REGADENOSON 0.4 MG/5ML IV SOLN
0.4000 mg | Freq: Once | INTRAVENOUS | Status: AC
  Administered 2012-09-30: 0.4 mg via INTRAVENOUS

## 2012-09-30 MED ORDER — TECHNETIUM TC 99M SESTAMIBI GENERIC - CARDIOLITE
30.0000 | Freq: Once | INTRAVENOUS | Status: AC | PRN
  Administered 2012-09-30: 30 via INTRAVENOUS

## 2012-09-30 MED ORDER — POTASSIUM CHLORIDE CRYS ER 20 MEQ PO TBCR
40.0000 meq | EXTENDED_RELEASE_TABLET | Freq: Once | ORAL | Status: AC
  Administered 2012-09-30: 40 meq via ORAL
  Filled 2012-09-30: qty 2

## 2012-09-30 MED ORDER — AMLODIPINE BESYLATE 5 MG PO TABS
5.0000 mg | ORAL_TABLET | Freq: Every day | ORAL | Status: DC
  Administered 2012-09-30 – 2012-10-01 (×2): 5 mg via ORAL
  Filled 2012-09-30 (×2): qty 1

## 2012-09-30 MED ORDER — TECHNETIUM TC 99M SESTAMIBI GENERIC - CARDIOLITE
10.0000 | Freq: Once | INTRAVENOUS | Status: AC | PRN
  Administered 2012-09-30: 10 via INTRAVENOUS

## 2012-09-30 NOTE — Progress Notes (Signed)
Utilization review completed.  

## 2012-09-30 NOTE — Progress Notes (Signed)
SUBJECTIVE:  Long discussion with the patient about symptoms.  Some atypical and some typical symptoms.  No objective evidence of ischemia.  This is the first occurrence.  He had resting symptoms.    PHYSICAL EXAM Filed Vitals:   09/29/12 1927 09/29/12 2050 09/29/12 2309 09/30/12 0436  BP: 170/103 203/94 163/84 160/70  Pulse: 98 69 85 70  Temp:  97.2 F (36.2 C)  98.1 F (36.7 C)  TempSrc:  Oral  Oral  Resp: 18 17  17   Height:  5\' 10"  (1.778 m)    Weight:  202 lb 2.6 oz (91.7 kg)    SpO2: 96% 100%  100%   General:  No distress HEENT: PERRL Lungs:  Clear Heart:  RRR with ectopy.  Systolic murmur.   Abdomen:  Positive bowel sounds, no rebound no guarding Extremities:  Decreased pulses lower extremity. Neuro:  Nonfocal  LABS: Lab Results  Component Value Date   TROPONINI <0.30 09/30/2012   Results for orders placed during the hospital encounter of 09/29/12 (from the past 24 hour(s))  CBC WITH DIFFERENTIAL     Status: Abnormal   Collection Time    09/29/12  6:00 PM      Result Value Range   WBC 4.5  4.0 - 10.5 K/uL   RBC 4.28  4.22 - 5.81 MIL/uL   Hemoglobin 12.6 (*) 13.0 - 17.0 g/dL   HCT 52.8 (*) 41.3 - 24.4 %   MCV 85.7  78.0 - 100.0 fL   MCH 29.4  26.0 - 34.0 pg   MCHC 34.3  30.0 - 36.0 g/dL   RDW 01.0  27.2 - 53.6 %   Platelets 94 (*) 150 - 400 K/uL   Neutrophils Relative % 50  43 - 77 %   Neutro Abs 2.3  1.7 - 7.7 K/uL   Lymphocytes Relative 33  12 - 46 %   Lymphs Abs 1.5  0.7 - 4.0 K/uL   Monocytes Relative 15 (*) 3 - 12 %   Monocytes Absolute 0.7  0.1 - 1.0 K/uL   Eosinophils Relative 2  0 - 5 %   Eosinophils Absolute 0.1  0.0 - 0.7 K/uL   Basophils Relative 0  0 - 1 %   Basophils Absolute 0.0  0.0 - 0.1 K/uL  BASIC METABOLIC PANEL     Status: Abnormal   Collection Time    09/29/12  6:00 PM      Result Value Range   Sodium 134 (*) 135 - 145 mEq/L   Potassium 3.4 (*) 3.5 - 5.1 mEq/L   Chloride 99  96 - 112 mEq/L   CO2 25  19 - 32 mEq/L   Glucose,  Bld 73  70 - 99 mg/dL   BUN 20  6 - 23 mg/dL   Creatinine, Ser 6.44  0.50 - 1.35 mg/dL   Calcium 9.2  8.4 - 03.4 mg/dL   GFR calc non Af Amer 51 (*) >90 mL/min   GFR calc Af Amer 59 (*) >90 mL/min  APTT     Status: None   Collection Time    09/29/12  6:00 PM      Result Value Range   aPTT 30  24 - 37 seconds  PROTIME-INR     Status: None   Collection Time    09/29/12  6:00 PM      Result Value Range   Prothrombin Time 12.9  11.6 - 15.2 seconds   INR 0.99  0.00 - 1.49  POCT I-STAT TROPONIN I     Status: None   Collection Time    09/29/12  6:07 PM      Result Value Range   Troponin i, poc 0.01  0.00 - 0.08 ng/mL   Comment 3           TROPONIN I     Status: None   Collection Time    09/29/12 10:30 PM      Result Value Range   Troponin I <0.30  <0.30 ng/mL  PROTIME-INR     Status: None   Collection Time    09/29/12 10:30 PM      Result Value Range   Prothrombin Time 13.5  11.6 - 15.2 seconds   INR 1.05  0.00 - 1.49  PRO B NATRIURETIC PEPTIDE     Status: None   Collection Time    09/29/12 10:30 PM      Result Value Range   Pro B Natriuretic peptide (BNP) 421.8  0 - 450 pg/mL  MAGNESIUM     Status: None   Collection Time    09/29/12 10:30 PM      Result Value Range   Magnesium 1.9  1.5 - 2.5 mg/dL  HEPARIN LEVEL (UNFRACTIONATED)     Status: Abnormal   Collection Time    09/30/12  1:00 AM      Result Value Range   Heparin Unfractionated 0.75 (*) 0.30 - 0.70 IU/mL  LIPID PANEL     Status: None   Collection Time    09/30/12  5:10 AM      Result Value Range   Cholesterol 157  0 - 200 mg/dL   Triglycerides 56  <409 mg/dL   HDL 51  >81 mg/dL   Total CHOL/HDL Ratio 3.1     VLDL 11  0 - 40 mg/dL   LDL Cholesterol 95  0 - 99 mg/dL  BASIC METABOLIC PANEL     Status: Abnormal   Collection Time    09/30/12  5:10 AM      Result Value Range   Sodium 137  135 - 145 mEq/L   Potassium 3.1 (*) 3.5 - 5.1 mEq/L   Chloride 101  96 - 112 mEq/L   CO2 28  19 - 32 mEq/L   Glucose,  Bld 76  70 - 99 mg/dL   BUN 17  6 - 23 mg/dL   Creatinine, Ser 1.91  0.50 - 1.35 mg/dL   Calcium 8.8  8.4 - 47.8 mg/dL   GFR calc non Af Amer 61 (*) >90 mL/min   GFR calc Af Amer 70 (*) >90 mL/min  CBC     Status: Abnormal   Collection Time    09/30/12  5:10 AM      Result Value Range   WBC 5.3  4.0 - 10.5 K/uL   RBC 4.20 (*) 4.22 - 5.81 MIL/uL   Hemoglobin 12.7 (*) 13.0 - 17.0 g/dL   HCT 29.5 (*) 62.1 - 30.8 %   MCV 84.3  78.0 - 100.0 fL   MCH 30.2  26.0 - 34.0 pg   MCHC 35.9  30.0 - 36.0 g/dL   RDW 65.7  84.6 - 96.2 %   Platelets 106 (*) 150 - 400 K/uL  TROPONIN I     Status: None   Collection Time    09/30/12  5:10 AM      Result Value Range   Troponin I <0.30  <0.30 ng/mL    Intake/Output Summary (  Last 24 hours) at 09/30/12 0924 Last data filed at 09/30/12 0540  Gross per 24 hour  Intake      0 ml  Output    675 ml  Net   -675 ml    ASSESSMENT AND PLAN:  Unstable angina:  Ruled out thus far.  I will order a stress perfusion study.    HYPERLIPIDEMIA:  Lipid OK on current therapy.   HYPERTENSION:  Blood pressure has been elevated overnight.  I will give Norvasc this AM.   PACEMAKER, PERMANENT:  Up to date with follow up.   Murmur:  I suspect mild aortic stenosis or sclerosis.  Follow up echo as an outpatient.     Fayrene Fearing [facility] 09/30/2012 9:24 AM

## 2012-09-30 NOTE — Progress Notes (Signed)
     Patient: [name] Date of Encounter: 09/30/2012, 8:05 AM Admit date: 09/29/2012     Subjective  [name] states his CP is now gone. He denies SOB.    Objective  Physical Exam: Vitals: BP 160/70  Pulse 70  Temp(Src) 98.1 F (36.7 C) (Oral)  Resp 17  Ht 5\' 10"  (1.778 m)  Wt 202 lb 2.6 oz (91.7 kg)  BMI 29.01 kg/m2  SpO2 100% General: Well developed, well appearing 79 year old male in no acute distress. He appears younger than his stated age. Neck: Supple. JVD not elevated. Lungs: Clear bilaterally to auscultation without wheezes, rales, or rhonchi. Breathing is unlabored. Heart: RRR S1 S2 without murmurs, rubs, or gallops.  Abdomen: Soft, non-distended. Extremities: No clubbing or cyanosis. No edema.  Distal pedal pulses are 2+ and equal bilaterally. Neuro: Alert and oriented X 3. Moves all extremities spontaneously. No focal deficits.  Intake/Output:  Intake/Output Summary (Last 24 hours) at 09/30/12 0805 Last data filed at 09/30/12 0540  Gross per 24 hour  Intake      0 ml  Output    675 ml  Net   -675 ml    Inpatient Medications:  . aspirin EC  81 mg Oral Daily  . atorvastatin  40 mg Oral Daily  . hydrochlorothiazide  25 mg Oral Daily  . lisinopril  20 mg Oral Daily  . sodium chloride  3 mL Intravenous Q12H  . timolol  1 drop Both Eyes Daily   . heparin 1,000 Units/hr (09/30/12 0334)    Labs:  Recent Labs  09/29/12 1800 09/29/12 2230 09/30/12 0510  NA 134*  --  137  K 3.4*  --  3.1*  CL 99  --  101  CO2 25  --  28  GLUCOSE 73  --  76  BUN 20  --  17  CREATININE 1.24  --  1.08  CALCIUM 9.2  --  8.8  MG  --  1.9  --     Recent Labs  09/29/12 1800 09/30/12 0510  WBC 4.5 5.3  NEUTROABS 2.3  --   HGB 12.6* 12.7*  HCT 36.7* 35.4*  MCV 85.7 84.3  PLT 94* 106*    Recent Labs  09/29/12 2230 09/30/12 0510  TROPONINI <0.30 <0.30    Recent Labs  09/30/12 0510  CHOL 157  HDL 51  LDLCALC 95  TRIG 56  CHOLHDL 3.1     Recent Labs  09/29/12 2230  INR 1.05    Radiology/Studies: Dg Chest 2 View 09/29/2012  Findings: There is elevation of the right hemidiaphragm.  Heart size is probably normal.  The patient has a left-sided transvenous pacemaker with leads to the right atrium and right ventricle. Lungs are clear.   IMPRESSION: No evidence for acute cardiopulmonary  abnormality. Elevated right hemidiaphragm.    Original Report Authenticated By: Norva Pavlov, M.D.    Telemetry: not on telemetry overnight     Assessment and Plan  1. Chest pain - resolved; troponin negative x 2; now on IV heparin; MD to advise re: stress test vs. cardiac cath; will keep NP; spoke with RN re: no telemetry so far; RN will place pt on telemetry now 2. CHB s/p PPM implant 3. HTN - BP elevated this AM; will add BB 4. Hypokalemia - will replete  Dr. Graciela Husbands to see Signed, Rick Duff PA-C

## 2012-09-30 NOTE — Progress Notes (Signed)
ANTICOAGULATION CONSULT NOTE - Follow Up Consult  Pharmacy Consult for heparin Indication: chest pain/ACS  Labs:  Recent Labs  09/29/12 1800 09/29/12 2230 09/30/12 0100  HGB 12.6*  --   --   HCT 36.7*  --   --   PLT 94*  --   --   APTT 30  --   --   LABPROT 12.9 13.5  --   INR 0.99 1.05  --   HEPARINUNFRC  --   --  0.75*  CREATININE 1.24  --   --   TROPONINI  --  <0.30  --     Assessment: 79yo male slightly supratherapeutic on heparin with initial dosing for CP.  Goal of Therapy:  Heparin level 0.3-0.7 units/ml   Plan:  Will decrease heparin gtt by 1 unit/kg/hr to 1000 units/hr and check level in 8hr.  Vernard Gambles, PharmD, BCPS  09/30/2012,3:06 AM

## 2012-09-30 NOTE — Progress Notes (Signed)
ANTICOAGULATION CONSULT NOTE - Follow Up Consult  Pharmacy Consult for heparin Indication: chest pain/ACS  No Known Allergies  Patient Measurements: Height: 5\' 10"  (177.8 cm) Weight: 202 lb 2.6 oz (91.7 kg) IBW/kg (Calculated) : 73 Heparin Dosing Weight: 92 kg  Vital Signs: Temp: 98.1 F (36.7 C) (07/22 0436) Temp src: Oral (07/22 0436) BP: 160/70 mmHg (07/22 0436) Pulse Rate: 70 (07/22 0436)  Labs:  Recent Labs  09/29/12 1800 09/29/12 2230 09/30/12 0100 09/30/12 0510 09/30/12 1105  HGB 12.6*  --   --  12.7*  --   HCT 36.7*  --   --  35.4*  --   PLT 94*  --   --  106*  --   APTT 30  --   --   --   --   LABPROT 12.9 13.5  --   --   --   INR 0.99 1.05  --   --   --   HEPARINUNFRC  --   --  0.75*  --  0.73*  CREATININE 1.24  --   --  1.08  --   TROPONINI  --  <0.30  --  <0.30 <0.30    Estimated Creatinine Clearance: 56.9 ml/min (by C-G formula based on Cr of 1.08).   Medications:  Scheduled:  . amLODipine  5 mg Oral Daily  . aspirin EC  81 mg Oral Daily  . atorvastatin  40 mg Oral Daily  . hydrochlorothiazide  25 mg Oral Daily  . lisinopril  20 mg Oral Daily  . potassium chloride  40 mEq Oral Once  . sodium chloride  3 mL Intravenous Q12H  . timolol  1 drop Both Eyes Daily   Infusions:  . heparin 1,000 Units/hr (09/30/12 0334)    Assessment: 79 yo male with chest pain is currently on supratherapeutic heparin.  Heparin level was 0.73.  Hgb 12.7 and Plt up to 106 K.  Verified previous rate with RN. Goal of Therapy:  Heparin level 0.3-0.7 units/ml Monitor platelets by anticoagulation protocol: Yes   Plan:  1) Reduce heparin to 850 units/hr 2) 8hr heparin level  Kameria Canizares, Tsz-Yin 09/30/2012,11:49 AM

## 2012-10-01 ENCOUNTER — Other Ambulatory Visit: Payer: Self-pay | Admitting: Nurse Practitioner

## 2012-10-01 ENCOUNTER — Encounter (HOSPITAL_COMMUNITY): Payer: Self-pay | Admitting: Nurse Practitioner

## 2012-10-01 DIAGNOSIS — R079 Chest pain, unspecified: Secondary | ICD-10-CM

## 2012-10-01 DIAGNOSIS — D696 Thrombocytopenia, unspecified: Secondary | ICD-10-CM

## 2012-10-01 DIAGNOSIS — R011 Cardiac murmur, unspecified: Secondary | ICD-10-CM

## 2012-10-01 DIAGNOSIS — R7989 Other specified abnormal findings of blood chemistry: Secondary | ICD-10-CM

## 2012-10-01 LAB — BASIC METABOLIC PANEL
BUN: 15 mg/dL (ref 6–23)
CO2: 28 mEq/L (ref 19–32)
Calcium: 8.8 mg/dL (ref 8.4–10.5)
Chloride: 102 mEq/L (ref 96–112)
Creatinine, Ser: 1.02 mg/dL (ref 0.50–1.35)

## 2012-10-01 LAB — CBC
HCT: 34.7 % — ABNORMAL LOW (ref 39.0–52.0)
MCH: 30 pg (ref 26.0–34.0)
MCV: 85.5 fL (ref 78.0–100.0)
RBC: 4.06 MIL/uL — ABNORMAL LOW (ref 4.22–5.81)
RDW: 13.9 % (ref 11.5–15.5)
WBC: 6.2 10*3/uL (ref 4.0–10.5)

## 2012-10-01 MED ORDER — AMLODIPINE BESYLATE 5 MG PO TABS: 5.0000 mg | ORAL_TABLET | Freq: Every day | ORAL | Status: DC

## 2012-10-01 NOTE — Discharge Summary (Signed)
Patient ID: [name],  MRN: [id number], DOB/AGE: 1925-12-28 79 y.o.  Admit date: 09/29/2012 Discharge date: 10/01/2012  Primary Care Provider: Oliver Barre Primary Cardiologist: Odessa Fleming, MD   Discharge Diagnoses Principal Problem:   Unstable angina  **Non-ischemic lexiscan cardiolite this admission. Active Problems:   HYPERTENSION   Thrombocytopenia  **Platelets of 86-106K this admission.  F/u scheduled for 8/5.   HYPERLIPIDEMIA   BENIGN PROSTATIC HYPERTROPHY   PACEMAKER, PERMANENT   Systolic Murmur  **Outpatient echo scheduled for 8/5.  Allergies No Known Allergies  Procedures  Lexiscan Cardiolite 09/30/2012  IMPRESSION:  1.  Diaphragmatic attenuation of the inferior wall. 2.  No findings for myocardial ischemia. 3.  Estimated ejection fraction 57%. _____________   History of Present Illness  79 y/o male with history of high-grade AV block s/p dual chamber PPM in 2009 who was in his usual state of health until the morning of admission when he developed substernal chest heaviness associated with mild dyspnea.  He presented to the [facility] ED where ECG was non-acute and initial troponin was normal.  Platelets were low at 94K.  He was treated with aspirin and heparin in the ED and was transferred to [facility] for cardiology admission.  He was pain free on arrival.  Hospital Course  Patient ruled out for MI.  He had no further chest pain.  He was hypertensive and amlodipine therapy was added to his regimen with some improvement in BP readings.  In the absence of objective evidence of ischemia, decision was made to pursue a lexiscan cardiolite.  This was performed on 7/22 and showed no evidence of ischemia with normal LV function.  He has been ambulating without difficulty and will be discharged home today in good condition.  Platelet count has remained low during admission but did not drop significantly further with the addition of heparin.  We have arranged for outpatient  f/u of CBC on 8/5.  He will also have repeat TSH and Free T4 at that time as TSH in the hospital registered low @ 0.292.  Discharge Vitals Blood pressure 153/61, pulse 67, temperature 98.5 F (36.9 C), temperature source Oral, resp. rate 18, height 5\' 10"  (1.778 m), weight 202 lb 2.6 oz (91.7 kg), SpO2 100.00%.  Filed Weights   09/29/12 1836 09/29/12 2050  Weight: 206 lb (93.441 kg) 202 lb 2.6 oz (91.7 kg)   Labs  CBC  Recent Labs  09/29/12 1800 09/30/12 0510 10/01/12 0445  WBC 4.5 5.3 6.2  NEUTROABS 2.3  --   --   HGB 12.6* 12.7* 12.2*  HCT 36.7* 35.4* 34.7*  MCV 85.7 84.3 85.5  PLT 94* 106* 86*   Basic Metabolic Panel  Recent Labs  09/29/12 1800 09/29/12 2230 09/30/12 0510 10/01/12 0445  NA 134*  --  137 137  K 3.4*  --  3.1* 3.9  CL 99  --  101 102  CO2 25  --  28 28  GLUCOSE 73  --  76 84  BUN 20  --  17 15  CREATININE 1.24  --  1.08 1.02  CALCIUM 9.2  --  8.8 8.8  MG  --  1.9  --   --    Cardiac Enzymes  Recent Labs  09/29/12 2230 09/30/12 0510 09/30/12 1105  TROPONINI <0.30 <0.30 <0.30   Hemoglobin A1C  Recent Labs  09/29/12 2230  HGBA1C 5.5   Fasting Lipid Panel  Recent Labs  09/30/12 0510  CHOL 157  HDL 51  LDLCALC 95  TRIG 56  CHOLHDL 3.1   Thyroid Function Tests  Recent Labs  09/29/12 2230  TSH 0.292*   Disposition  Pt is being discharged home today in good condition.  Follow-up Plans & Appointments  Follow-up Information   Follow up with EDMISTEN, BROOKE, PA-C On 10/14/2012. (10:00 AM for echocardiogram (heart ultrasound to assess murmur) followed by office appointment.)    Contact information:   [address] [place] [address] [phone]       Follow up with Oliver Barre, MD.   Contact information:   [address] [place] [address] [phone]      Discharge Medications    Medication List         amLODipine 5 MG tablet  Commonly known as:  NORVASC  Take 1 tablet (5 mg  total) by mouth daily.     aspirin 81 MG tablet  Take 81 mg by mouth daily.     atorvastatin 40 MG tablet  Commonly known as:  LIPITOR  Take 1 tablet (40 mg total) by mouth daily.     lisinopril-hydrochlorothiazide 20-25 MG per tablet  Commonly known as:  PRINZIDE,ZESTORETIC  Take 1 tablet by mouth daily.     timolol 0.5 % ophthalmic solution  Commonly known as:  BETIMOL  Place 1 drop into both eyes daily.       Outstanding Labs/Studies  CBC, TSH, Free T4, and echocardiogram scheduled for 8/5.  Duration of Discharge Encounter   Greater than 30 minutes including physician time.  Signed, Nicolasa Ducking NP 10/01/2012, 9:59 AM

## 2012-10-01 NOTE — Progress Notes (Signed)
CARDIAC REHAB PHASE I   PRE:  Rate/Rhythm: 76 Pacing PVC's  BP:  Supine:   Sitting: 148/76  Standing:    SaO2: 100 RA  MODE:  Ambulation: 550 ft   POST:  Rate/Rhythm: 80 Pacing PVC's  BP:  Supine:   Sitting: 128/60  Standing:    SaO2: 100 RA 0915-1000 Late entry Pt tolerated ambulation well without c/o of cp or SOB VS stable. Pt to recliner after walk with call light in reach. Completed risk factors education with pt. Discussed risk factor modifications, heart healthy diet, exercise, proper use of sl NTG and calling MD or 911 if he is not prescribed NTG. Pt voices understanding.  Melina Copa RN 10/01/2012 10:58 AM

## 2012-10-01 NOTE — Progress Notes (Signed)
ANTICOAGULATION CONSULT NOTE - Follow Up Consult  Pharmacy Consult:  Heparin Indication: chest pain/ACS  No Known Allergies  Patient Measurements: Height: 5\' 10"  (177.8 cm) Weight: 202 lb 2.6 oz (91.7 kg) IBW/kg (Calculated) : 73 Heparin Dosing Weight: 92 kg  Vital Signs: Temp: 98.5 F (36.9 C) (07/23 0620) Temp src: Oral (07/23 0620) BP: 153/61 mmHg (07/23 0620) Pulse Rate: 67 (07/23 0620)  Labs:  Recent Labs  09/29/12 1800 09/29/12 2230  09/30/12 0510 09/30/12 1105 09/30/12 2243 10/01/12 0445  HGB 12.6*  --   --  12.7*  --   --  12.2*  HCT 36.7*  --   --  35.4*  --   --  34.7*  PLT 94*  --   --  106*  --   --  86*  APTT 30  --   --   --   --   --   --   LABPROT 12.9 13.5  --   --   --   --   --   INR 0.99 1.05  --   --   --   --   --   HEPARINUNFRC  --   --   < >  --  0.73* 0.69 0.45  CREATININE 1.24  --   --  1.08  --   --  1.02  TROPONINI  --  <0.30  --  <0.30 <0.30  --   --   < > = values in this interval not displayed. Estimated Creatinine Clearance: 60.3 ml/min (by C-G formula based on Cr of 1.02).     Assessment: 87 YOM to continue on IV heparin for ACS.  Heparin level therapeutic.  Patient's platelets are low; no bleeding reported.  Noted plan for discharge.   Goal of Therapy:  Heparin level 0.3-0.7 units/ml Monitor platelets by anticoagulation protocol: Yes    Plan:  - Heparin gtt at 800 units/hr - Daily HL / CBC - Consider checking T3 / T4 to assess thyroid function as outpatient     Najee Cowens D. Laney Potash, PharmD, BCPS Pager:  [phone] 10/01/2012, 8:51 AM

## 2012-10-01 NOTE — Progress Notes (Signed)
Discharge Note: Pt is alert and oriented, VS are stable, denies CP.  Telemetry & IV discontinued.  Discharge instructions reviewed with patient, pt verbalizes understanding.  Wheelchair transportation provided, all belongings with patient  

## 2012-10-01 NOTE — Progress Notes (Signed)
Patient evaluated for long-term disease management services with [facility] as a benefit of his [employer]. He is discharged today. Explained services and consents signed. Patient will receive a post discharge transition of care call and evaluated for monthly home visits for assessments and for education.      Raiford Noble, MSN-Ed, RN,BSN, [facility], [phone]

## 2012-10-01 NOTE — Progress Notes (Signed)
   Subjective:  Patient feels well this am.  No further chest pain. Anxious for discharge home. Myoview yesterday showed old inferior wall scar but no ischemia. EF 57%.  Objective:  Vital Signs in the last 24 hours: Temp:  [97.8 F (36.6 C)-98.5 F (36.9 C)] 98.5 F (36.9 C) (07/23 0620) Pulse Rate:  [67-86] 67 (07/23 0620) Resp:  [18] 18 (07/23 0620) BP: (133-204)/(61-100) 153/61 mmHg (07/23 0620) SpO2:  [100 %] 100 % (07/23 0620)  Intake/Output from previous day:   Intake/Output from this shift:    . amLODipine  5 mg Oral Daily  . aspirin EC  81 mg Oral Daily  . atorvastatin  40 mg Oral Daily  . hydrochlorothiazide  25 mg Oral Daily  . lisinopril  20 mg Oral Daily  . sodium chloride  3 mL Intravenous Q12H  . timolol  1 drop Both Eyes Daily   . heparin 800 Units/hr (10/01/12 0035)    Physical Exam: The patient appears to be in no distress.  Head and neck exam reveals that the pupils are equal and reactive.  The extraocular movements are full.  There is no scleral icterus.  Mouth and pharynx are benign.  No lymphadenopathy.  No carotid bruits.  The jugular venous pressure is normal.  Thyroid is not enlarged or tender.  Chest is clear to percussion and auscultation.  No rales or rhonchi.  Expansion of the chest is symmetrical. Pacer in left upper chest.  Heart reveals no abnormal lift or heave.  First and second heart sounds are normal.  There is no  gallop rub or click. Soft systolic murmur at base.  The abdomen is soft and nontender.  Bowel sounds are normoactive.  There is no hepatosplenomegaly or mass.  There are no abdominal bruits.  Extremities reveal no phlebitis or edema.  Pedal pulses are good.  There is no cyanosis or clubbing.  Neurologic exam is normal strength and no lateralizing weakness.  No sensory deficits.  Integument reveals no rash  Lab Results:  Recent Labs  09/30/12 0510 10/01/12 0445  WBC 5.3 6.2  HGB 12.7* 12.2*  PLT 106* 86*     Recent Labs  09/30/12 0510 10/01/12 0445  NA 137 137  K 3.1* 3.9  CL 101 102  CO2 28 28  GLUCOSE 76 84  BUN 17 15  CREATININE 1.08 1.02    Recent Labs  09/30/12 0510 09/30/12 1105  TROPONINI <0.30 <0.30   Hepatic Function Panel No results found for this basename: PROT, ALBUMIN, AST, ALT, ALKPHOS, BILITOT, BILIDIR, IBILI,  in the last 72 hours  Recent Labs  09/30/12 0510  CHOL 157   No results found for this basename: PROTIME,  in the last 72 hours  Imaging: Imaging results have been reviewed  Cardiac Studies: Telemetry shows atrial paced rhythm. Assessment/Plan:  1. Chest pain. Cardiac enzymes normal. Myoview shows no ischemia.  2. Hypertension. 3. Functioning dual chamber pacemaker. 4. Hyperlipidemia. 5. Systolic heart murmur.  Plan: okay for discharge today.  Followup with Dr. Graciela Husbands. Outpatient echo. Followup CBC re platelets as outpatient.  LOS: 2 days    [name] 10/01/2012, 7:56 AM

## 2012-10-01 NOTE — Progress Notes (Signed)
ANTICOAGULATION CONSULT NOTE - Follow Up Consult  Pharmacy Consult for heparin Indication: chest pain/ACS  No Known Allergies  Patient Measurements: Height: 5\' 10"  (177.8 cm) Weight: 202 lb 2.6 oz (91.7 kg) IBW/kg (Calculated) : 73 Heparin Dosing Weight: 92 kg  Vital Signs: Temp: 97.8 F (36.6 C) (07/22 2103) Temp src: Oral (07/22 2103) BP: 169/76 mmHg (07/22 2239) Pulse Rate: 86 (07/22 2103)  Labs:  Recent Labs  09/29/12 1800 09/29/12 2230 09/30/12 0100 09/30/12 0510 09/30/12 1105 09/30/12 2243  HGB 12.6*  --   --  12.7*  --   --   HCT 36.7*  --   --  35.4*  --   --   PLT 94*  --   --  106*  --   --   APTT 30  --   --   --   --   --   LABPROT 12.9 13.5  --   --   --   --   INR 0.99 1.05  --   --   --   --   HEPARINUNFRC  --   --  0.75*  --  0.73* 0.69  CREATININE 1.24  --   --  1.08  --   --   TROPONINI  --  <0.30  --  <0.30 <0.30  --    Estimated Creatinine Clearance: 56.9 ml/min (by C-G formula based on Cr of 1.08).  Medications:  Scheduled:  . amLODipine  5 mg Oral Daily  . aspirin EC  81 mg Oral Daily  . atorvastatin  40 mg Oral Daily  . hydrochlorothiazide  25 mg Oral Daily  . lisinopril  20 mg Oral Daily  . sodium chloride  3 mL Intravenous Q12H  . timolol  1 drop Both Eyes Daily   Infusions:  . heparin 850 Units/hr (09/30/12 1507)   Assessment: 79 yo male with chest pain and was started on IV heparin while his cardiac issues were being worked up.  His heparin level result this evening is 0.69 which is the upper end of the desired range but at the higher end.   I spoke with the nurse who reports no problems with his IV and no noted bleeding.  We will reduce his rate a little more to keep him within the desired range.  Goal of Therapy:  Heparin level 0.3-0.7 units/ml Monitor platelets by anticoagulation protocol: Yes   Plan:  1) Reduce heparin to 800 units/hr - have discussed this change with his nurse 2) Check AM heparin level and CBC  Nadara Mustard, PharmD., MS Clinical Pharmacist Pager:  [phone] Thank you for allowing pharmacy to be part of this patients care team. 10/01/2012,12:27 AM

## 2012-10-02 ENCOUNTER — Encounter: Payer: Self-pay | Admitting: *Deleted

## 2012-10-14 ENCOUNTER — Encounter: Payer: Self-pay | Admitting: Cardiology

## 2012-10-14 ENCOUNTER — Other Ambulatory Visit (HOSPITAL_COMMUNITY): Payer: Self-pay | Admitting: Cardiology

## 2012-10-14 ENCOUNTER — Ambulatory Visit (INDEPENDENT_AMBULATORY_CARE_PROVIDER_SITE_OTHER): Payer: Medicare Other | Admitting: Cardiology

## 2012-10-14 ENCOUNTER — Ambulatory Visit (HOSPITAL_COMMUNITY): Payer: Medicare Other | Attending: Internal Medicine | Admitting: Radiology

## 2012-10-14 VITALS — BP 161/78 | HR 64 | Ht 70.0 in | Wt 202.1 lb

## 2012-10-14 DIAGNOSIS — I4891 Unspecified atrial fibrillation: Secondary | ICD-10-CM

## 2012-10-14 DIAGNOSIS — Z86718 Personal history of other venous thrombosis and embolism: Secondary | ICD-10-CM | POA: Insufficient documentation

## 2012-10-14 DIAGNOSIS — E785 Hyperlipidemia, unspecified: Secondary | ICD-10-CM | POA: Insufficient documentation

## 2012-10-14 DIAGNOSIS — I10 Essential (primary) hypertension: Secondary | ICD-10-CM | POA: Insufficient documentation

## 2012-10-14 DIAGNOSIS — R011 Cardiac murmur, unspecified: Secondary | ICD-10-CM

## 2012-10-14 DIAGNOSIS — Z950 Presence of cardiac pacemaker: Secondary | ICD-10-CM | POA: Insufficient documentation

## 2012-10-14 DIAGNOSIS — R079 Chest pain, unspecified: Secondary | ICD-10-CM

## 2012-10-14 DIAGNOSIS — D696 Thrombocytopenia, unspecified: Secondary | ICD-10-CM

## 2012-10-14 DIAGNOSIS — I442 Atrioventricular block, complete: Secondary | ICD-10-CM

## 2012-10-14 DIAGNOSIS — I4430 Unspecified atrioventricular block: Secondary | ICD-10-CM

## 2012-10-14 LAB — PACEMAKER DEVICE OBSERVATION
AL AMPLITUDE: 5.6 mv
AL IMPEDENCE PM: 494 Ohm
AL THRESHOLD: 0.75 V
RV LEAD AMPLITUDE: 8 mv
RV LEAD IMPEDENCE PM: 992 Ohm

## 2012-10-14 LAB — CBC WITH DIFFERENTIAL/PLATELET
Basophils Absolute: 0 10*3/uL (ref 0.0–0.1)
Eosinophils Absolute: 0.1 10*3/uL (ref 0.0–0.7)
HCT: 34.9 % — ABNORMAL LOW (ref 39.0–52.0)
Lymphs Abs: 1.3 10*3/uL (ref 0.7–4.0)
MCV: 89.3 fl (ref 78.0–100.0)
Monocytes Absolute: 0.7 10*3/uL (ref 0.1–1.0)
Neutrophils Relative %: 56.9 % (ref 43.0–77.0)
Platelets: 103 10*3/uL — ABNORMAL LOW (ref 150.0–400.0)
RDW: 14.5 % (ref 11.5–14.6)

## 2012-10-14 MED ORDER — PERFLUTREN PROTEIN A MICROSPH IV SUSP
2.0000 mL | Freq: Once | INTRAVENOUS | Status: AC
  Administered 2012-10-14: 2 mL via INTRAVENOUS

## 2012-10-14 NOTE — Progress Notes (Signed)
Echocardiogram performed.  

## 2012-10-14 NOTE — Patient Instructions (Addendum)
Your physician recommends that you return for lab work in: CBC  Your physician recommends that you schedule a follow-up appointment in: CALL OFFICE BY MARCH TO MAKE AN APPOINTMENT FOR DR. KLEIN IN April.   Your physician recommends that you continue on your current medications as directed. Please refer to the Current Medication list given to you today.

## 2012-10-14 NOTE — Progress Notes (Signed)
ELECTROPHYSIOLOGY OFFICE NOTE  Patient ID: Breaker Springer MRN: [id number], DOB/AGE: 18-Jan-1926   Date of Visit: 10/14/2012  Primary Physician: Oliver Barre, MD Primary Cardiologist: Berton Mount, MD Reason for Visit: Hospital follow-up  History of Present Illness  Jahki Witham is a 79 y.o. male with high-grade AV block s/p dual chamber PPM in 2009 who was in his usual state of health until 09/29/2012 when he developed substernal chest heaviness associated with mild dyspnea. He presented to the [facility] ED where ECG was non-acute and initial troponin was normal. Platelets were low at 94,000. He was treated with aspirin and heparin in the ED and was transferred to [facility] for admission. His CP resolved. He ruled out for MI with serial negative CEs. He was hypertensive and amlodipine was added to his regimen with improvement. In the absence of objective evidence of ischemia, decision was made to pursue a Lexiscan nuclear stress test. This was performed on 09/30/2012 and showed no evidence of ischemia with normal LV function. Of note, his platelet count remained low during admission but did not drop significantly with the addition of heparin. He was discharged on 09/30/2012.  He presents today for hospital followup. Since discharge, he reports he is doing well and has no complaints. He denies chest pain or shortness of breath. He denies palpitations, dizziness, near syncope or syncope. He denies LE swelling, orthopnea, PND or recent weight gain. He is compliant and tolerating medications without difficulty.  Past Medical History Past Medical History  Diagnosis Date  . Atrioventricular block, complete     intermittent  . Pacemaker     medtronic dual chamber  . Hepatotoxicity   . Lipoma   . DVT femoral (deep venous thrombosis) with thrombophlebitis     LUE  . Hyperlipidemia   . Hx of colonic polyps   . Erectile dysfunction   . BPH (benign prostatic hypertrophy)   . HTN (hypertension)   .  Chest pain     a. 09/2012 Lexican Cardiolite: EF 57%, diaphragmatic atten of inf wall w/o ischemia.    Past Surgical History Past Surgical History  Procedure Laterality Date  . Pacemaker insertion  04/06/08    Medtronic    Allergies/Intolerances No Known Allergies  Current Home Medications Current Outpatient Prescriptions  Medication Sig Dispense Refill  . amLODipine (NORVASC) 5 MG tablet Take 1 tablet (5 mg total) by mouth daily.  30 tablet  6  . aspirin 81 MG tablet Take 81 mg by mouth daily.      Marland Kitchen atorvastatin (LIPITOR) 40 MG tablet Take 1 tablet (40 mg total) by mouth daily.  90 tablet  3  . lisinopril-hydrochlorothiazide (PRINZIDE,ZESTORETIC) 20-25 MG per tablet Take 1 tablet by mouth daily.  90 tablet  3  . timolol (BETIMOL) 0.5 % ophthalmic solution Place 1 drop into both eyes daily.       No current facility-administered medications for this visit.   Social History Social History  . Marital Status: Married   Social History Main Topics  . Smoking status: Former Games developer  . Smokeless tobacco: No  . Alcohol Use: No  . Drug Use: No   Review of Systems General: No chills, fever, night sweats or weight changes Cardiovascular: No chest pain, dyspnea on exertion, edema, orthopnea, palpitations, paroxysmal nocturnal dyspnea Dermatological: No rash, lesions or masses Respiratory: No cough, dyspnea Urologic: No hematuria, dysuria Abdominal: No nausea, vomiting, diarrhea, bright red blood per rectum, melena, or hematemesis Neurologic: No visual changes, weakness, changes in mental status  All other systems reviewed and are otherwise negative except as noted above.  Physical Exam Vitals: Blood pressure 143/84, pulse 81, height 5\' 10"  (1.778 m), weight 202 lb 1.9 oz (91.681 kg).  General: Well developed, well appearing 79 y.o. male in no acute distress. HEENT: Normocephalic, atraumatic. EOMs intact. Sclera nonicteric. Oropharynx clear.  Neck: Supple. No JVD. Lungs: Respirations  regular and unlabored, CTA bilaterally. No wheezes, rales or rhonchi. Heart: RRR. S1, S2 present. Soft II/VI systolic murmur along LSB. No rub, S3 or S4. Abdomen: Soft, non-distended.  Extremities: No clubbing, cyanosis or edema. PT/Radials 2+ and equal bilaterally. Psych: Normal affect. Neuro: Alert and oriented X 3. Moves all extremities spontaneously.   Diagnostics Echocardiogram done today in office; report pending Device interrogation today - Normal device function. Thresholds, sensing, impedances consistent with previous measurements. Device programmed to maximize longevity. 8 mode switch episodes, longest 1 minute 49 seconds, EGM consistent with AF. No high ventricular rates noted. Device programmed at appropriate safety margins. Histogram distribution appropriate for patient activity level. Device programmed to optimize intrinsic conduction. Estimated longevity 5.5 years.   Assessment and Plan 1. Chest pain - resolved; MI ruled out - Lexiscan nuclear stress test negative for ishemia, normal LVEF 2. Thrombocytopenia - will repeat CBC today 3. Murmur - echo pending 4. High grade AV block s/p PPM implant - normal device function - no programming changes made - continue routine remote PPM follow-up every 3 months 5. PAF - longest episode by interrogation today is 1 minute 49 seconds - found on device interrogations previously with longest episode found at last visit with Dr. Graciela Husbands in April 2014 (5 minutes) - Dr. Graciela Husbands recommended close follow-up and if has AF with duration >5 minutes, consider anticoagulation  Signed, Rick Duff, PA-C 10/14/2012, 12:43 PM

## 2012-10-23 ENCOUNTER — Ambulatory Visit (INDEPENDENT_AMBULATORY_CARE_PROVIDER_SITE_OTHER): Payer: Medicare Other | Admitting: Internal Medicine

## 2012-10-23 ENCOUNTER — Encounter: Payer: Self-pay | Admitting: Internal Medicine

## 2012-10-23 VITALS — BP 122/82 | HR 74 | Temp 97.0°F | Ht 70.0 in | Wt 201.3 lb

## 2012-10-23 DIAGNOSIS — Z0000 Encounter for general adult medical examination without abnormal findings: Secondary | ICD-10-CM

## 2012-10-23 DIAGNOSIS — D696 Thrombocytopenia, unspecified: Secondary | ICD-10-CM

## 2012-10-23 DIAGNOSIS — E785 Hyperlipidemia, unspecified: Secondary | ICD-10-CM

## 2012-10-23 DIAGNOSIS — N529 Male erectile dysfunction, unspecified: Secondary | ICD-10-CM | POA: Insufficient documentation

## 2012-10-23 DIAGNOSIS — I10 Essential (primary) hypertension: Secondary | ICD-10-CM

## 2012-10-23 NOTE — Assessment & Plan Note (Signed)
stable overall by history and exam, recent data reviewed with pt, and pt to continue medical treatment as before,  to f/u any worsening symptoms or concerns Lab Results  Component Value Date   LDLCALC 95 09/30/2012

## 2012-10-23 NOTE — Assessment & Plan Note (Signed)
Ok for stendra samples,  to f/u any worsening symptoms or concerns

## 2012-10-23 NOTE — Assessment & Plan Note (Signed)
Etiology unclear, exam bening, for f/u lab at 4 wks

## 2012-10-23 NOTE — Assessment & Plan Note (Signed)
stable overall by history and exam, recent data reviewed with pt, and pt to continue medical treatment as before,  to f/u any worsening symptoms or concerns BP Readings from Last 3 Encounters:  10/23/12 122/82  10/14/12 161/78  10/01/12 144/67

## 2012-10-23 NOTE — Progress Notes (Signed)
Subjective:    Patient ID: [name], male    DOB: [birth year], 79 y.o.   MRN: [id number]  HPI  Here to f/u; overall doing ok,  Pt denies chest pain, increased sob or doe, wheezing, orthopnea, PND, increased LE swelling, palpitations, dizziness or syncope.  Pt denies polydipsia, polyuria, or low sugar symptoms such as weakness or confusion improved with po intake.  Pt denies new neurological symptoms such as new headache, or facial or extremity weakness or numbness.   Pt states overall good compliance with meds, has been trying to follow lower cholesterol diet, with wt overall stable,  but little exercise however Just saw urology, Dr Annabell Howells, doing well, to cont the flomax, f/u at one yr.  Did try the stendra 100 mg which worked better for him .  Had low plt with last labs.  No overt bleeding or bruising.  No prior hx. Past Medical History  Diagnosis Date  . Atrioventricular block, complete     intermittent  . Pacemaker     medtronic dual chamber  . Hepatotoxicity   . Lipoma   . DVT femoral (deep venous thrombosis) with thrombophlebitis     LUE  . Hyperlipidemia   . Hx of colonic polyps   . Erectile dysfunction   . BPH (benign prostatic hypertrophy)   . HTN (hypertension)   . Chest pain     a. 09/2012 Lexican Cardiolite: EF 57%, diaphragmatic atten of inf wall w/o ischemia.   Past Surgical History  Procedure Laterality Date  . Pacemaker insertion  04/06/08    Medtronic    reports that he has quit smoking. He does not have any smokeless tobacco history on file. He reports that he does not drink alcohol. His drug history is not on file. family history includes Arthritis in an other family member; Heart attack in an other family member. No Known Allergies Current Outpatient Prescriptions on File Prior to Visit  Medication Sig Dispense Refill  . amLODipine (NORVASC) 5 MG tablet Take 1 tablet (5 mg total) by mouth daily.  30 tablet  6  . aspirin 81 MG tablet Take 81 mg by mouth daily.       Marland Kitchen atorvastatin (LIPITOR) 40 MG tablet Take 1 tablet (40 mg total) by mouth daily.  90 tablet  3  . lisinopril-hydrochlorothiazide (PRINZIDE,ZESTORETIC) 20-25 MG per tablet Take 1 tablet by mouth daily.  90 tablet  3  . timolol (BETIMOL) 0.5 % ophthalmic solution Place 1 drop into both eyes daily.       No current facility-administered medications on file prior to visit.    Review of Systems  Constitutional: Negative for unexpected weight change, or unusual diaphoresis  HENT: Negative for tinnitus.   Eyes: Negative for photophobia and visual disturbance.  Respiratory: Negative for choking and stridor.   Gastrointestinal: Negative for vomiting and blood in stool.  Genitourinary: Negative for hematuria and decreased urine volume.  Musculoskeletal: Negative for acute joint swelling Skin: Negative for color change and wound.  Neurological: Negative for tremors and numbness other than noted  Psychiatric/Behavioral: Negative for decreased concentration or  hyperactivity.       Objective:   Physical Exam BP 122/82  Pulse 74  Temp(Src) 97 F (36.1 C) (Oral)  Ht 5\' 10"  (1.778 m)  Wt 201 lb 5 oz (91.315 kg)  BMI 28.89 kg/m2  SpO2 97% VS noted,  Constitutional: Pt appears well-developed and well-nourished.  HENT: Head: NCAT.  Right Ear: External ear normal.  Left Ear: External  ear normal.  Eyes: Conjunctivae and EOM are normal. Pupils are equal, round, and reactive to light.  Neck: Normal range of motion. Neck supple.  Cardiovascular: Normal rate and regular rhythm.   Pulmonary/Chest: Effort normal and breath sounds normal.  Abd:  Soft, NT, non-distended, + BS, no HSM Neurological: Pt is alert. Not confused  Skin: Skin is warm. No erythema. No bruising or swelling Psychiatric: Pt behavior is normal. Thought content normal.     Assessment & Plan:

## 2012-10-23 NOTE — Patient Instructions (Signed)
Please continue all other medications as before, and refills have been done if requested. You are given the sample of stendra 100 mg today Please return in 1 months for LAB only - the "CBC" to check the platelets  Please return in 6 months, or sooner if needed, with Lab testing done 3-5 days before

## 2012-11-13 ENCOUNTER — Other Ambulatory Visit (INDEPENDENT_AMBULATORY_CARE_PROVIDER_SITE_OTHER): Payer: Medicare Other

## 2012-11-13 ENCOUNTER — Encounter: Payer: Self-pay | Admitting: Internal Medicine

## 2012-11-13 DIAGNOSIS — D696 Thrombocytopenia, unspecified: Secondary | ICD-10-CM

## 2012-11-13 LAB — CBC WITH DIFFERENTIAL/PLATELET
Basophils Relative: 0.4 % (ref 0.0–3.0)
HCT: 34.9 % — ABNORMAL LOW (ref 39.0–52.0)
Hemoglobin: 12.1 g/dL — ABNORMAL LOW (ref 13.0–17.0)
Lymphocytes Relative: 19.9 % (ref 12.0–46.0)
Monocytes Relative: 12.4 % — ABNORMAL HIGH (ref 3.0–12.0)
Neutro Abs: 3.4 10*3/uL (ref 1.4–7.7)
RBC: 3.92 Mil/uL — ABNORMAL LOW (ref 4.22–5.81)

## 2012-12-18 ENCOUNTER — Ambulatory Visit (INDEPENDENT_AMBULATORY_CARE_PROVIDER_SITE_OTHER): Payer: Medicare Other

## 2012-12-18 DIAGNOSIS — Z23 Encounter for immunization: Secondary | ICD-10-CM

## 2012-12-22 ENCOUNTER — Encounter: Payer: Medicare Other | Admitting: *Deleted

## 2012-12-25 ENCOUNTER — Encounter: Payer: Self-pay | Admitting: Internal Medicine

## 2013-01-05 ENCOUNTER — Encounter: Payer: Self-pay | Admitting: *Deleted

## 2013-01-21 ENCOUNTER — Ambulatory Visit (INDEPENDENT_AMBULATORY_CARE_PROVIDER_SITE_OTHER): Payer: Medicare Other

## 2013-01-21 DIAGNOSIS — Z950 Presence of cardiac pacemaker: Secondary | ICD-10-CM

## 2013-01-21 DIAGNOSIS — I441 Atrioventricular block, second degree: Secondary | ICD-10-CM

## 2013-01-22 LAB — MDC_IDC_ENUM_SESS_TYPE_REMOTE
Brady Statistic AP VP Percent: 17 %
Brady Statistic AS VS Percent: 50 %
Lead Channel Impedance Value: 980 Ohm
Lead Channel Pacing Threshold Amplitude: 1.25 V
Lead Channel Pacing Threshold Pulse Width: 0.4 ms
Lead Channel Sensing Intrinsic Amplitude: 11.2 mV
Lead Channel Setting Pacing Amplitude: 2 V
Lead Channel Setting Sensing Sensitivity: 2.8 mV

## 2013-02-17 ENCOUNTER — Encounter: Payer: Self-pay | Admitting: *Deleted

## 2013-03-02 ENCOUNTER — Encounter: Payer: Self-pay | Admitting: Internal Medicine

## 2013-04-27 ENCOUNTER — Encounter: Payer: Medicare Other | Admitting: *Deleted

## 2013-05-04 ENCOUNTER — Encounter: Payer: Self-pay | Admitting: *Deleted

## 2013-06-11 ENCOUNTER — Ambulatory Visit (INDEPENDENT_AMBULATORY_CARE_PROVIDER_SITE_OTHER): Payer: Medicare Other | Admitting: Internal Medicine

## 2013-06-11 ENCOUNTER — Encounter: Payer: Self-pay | Admitting: Internal Medicine

## 2013-06-11 VITALS — BP 152/69 | HR 81 | Ht 70.0 in | Wt 202.0 lb

## 2013-06-11 DIAGNOSIS — I442 Atrioventricular block, complete: Secondary | ICD-10-CM

## 2013-06-11 DIAGNOSIS — I4891 Unspecified atrial fibrillation: Secondary | ICD-10-CM

## 2013-06-11 DIAGNOSIS — Z950 Presence of cardiac pacemaker: Secondary | ICD-10-CM

## 2013-06-11 LAB — MDC_IDC_ENUM_SESS_TYPE_INCLINIC
Brady Statistic AP VP Percent: 17.2 %
Brady Statistic AS VS Percent: 47.8 %
Lead Channel Pacing Threshold Amplitude: 0.75 V
Lead Channel Sensing Intrinsic Amplitude: 11.2 mV
Lead Channel Sensing Intrinsic Amplitude: 2.8 mV
Lead Channel Setting Pacing Pulse Width: 0.4 ms
MDC IDC MSMT BATTERY VOLTAGE: 2.77 V
MDC IDC MSMT LEADCHNL RA IMPEDANCE VALUE: 473 Ohm
MDC IDC MSMT LEADCHNL RA PACING THRESHOLD PULSEWIDTH: 0.4 ms
MDC IDC MSMT LEADCHNL RV IMPEDANCE VALUE: 992 Ohm
MDC IDC MSMT LEADCHNL RV PACING THRESHOLD AMPLITUDE: 1.125 V
MDC IDC MSMT LEADCHNL RV PACING THRESHOLD PULSEWIDTH: 0.4 ms
MDC IDC SET LEADCHNL RA PACING AMPLITUDE: 2 V
MDC IDC SET LEADCHNL RV PACING AMPLITUDE: 2.5 V
MDC IDC SET LEADCHNL RV SENSING SENSITIVITY: 2.8 mV
MDC IDC STAT BRADY AP VS PERCENT: 2.8 %
MDC IDC STAT BRADY AS VP PERCENT: 32.1 %

## 2013-06-11 NOTE — Progress Notes (Signed)
PCP: Cathlean Cower, MD  [name] is a 80 y.o. male who presents today for routine electrophysiology followup.  He is status post pacemaker for high-grade heart block. She underwent generator replacement 2009   Since last being seen in our clinic, the patient reports doing very well.    Echo 10/2012 demonstrated EF 55-60%, no RWMA, grade 1 diastolic dysfunction, mild MR.   Today, he denies symptoms of palpitations, chest pain, shortness of breath,  lower extremity edema, dizziness, presyncope, or syncope.  The patient is otherwise without complaint today.   Past Medical History  Diagnosis Date  . Atrioventricular block, complete     intermittent  . Pacemaker     medtronic dual chamber  . Hepatotoxicity   . Lipoma   . DVT femoral (deep venous thrombosis) with thrombophlebitis     LUE  . Hyperlipidemia   . Hx of colonic polyps   . Erectile dysfunction   . BPH (benign prostatic hypertrophy)   . HTN (hypertension)   . Chest pain     a. 09/2012 Lexican Cardiolite: EF 57%, diaphragmatic atten of inf wall w/o ischemia.   Past Surgical History  Procedure Laterality Date  . Pacemaker insertion  04/06/08    Medtronic    Current Outpatient Prescriptions  Medication Sig Dispense Refill  . amLODipine (NORVASC) 5 MG tablet Take 1 tablet (5 mg total) by mouth daily.  30 tablet  6  . aspirin 81 MG tablet Take 81 mg by mouth daily.      Marland Kitchen atorvastatin (LIPITOR) 40 MG tablet Take 1 tablet (40 mg total) by mouth daily.  90 tablet  3  . lisinopril-hydrochlorothiazide (PRINZIDE,ZESTORETIC) 20-25 MG per tablet Take 1 tablet by mouth daily.  90 tablet  3  . timolol (BETIMOL) 0.5 % ophthalmic solution Place 1 drop into both eyes daily.       No current facility-administered medications for this visit.    Physical Exam: Filed Vitals:   06/11/13 1517  BP: 152/69  Pulse: 81  Height: 5\' 10"  (1.778 m)  Weight: 202 lb (91.627 kg)   Well developed and nourished in no acute distress HENT  normal Neck supple with JVP-flat Clear Device pocket well healed; without hematoma or erythema.  There is no tethering  Regular rate and rhythm, no murmurs or gallops Abd-soft with active BS No Clubbing cyanosis edema Skin-warm and dry A & Oriented  Grossly normal sensory and motor function  ECG demonstrates atrial pacing with variable AV delays range in from 162 350 ms   Pacemaker interrogation- reviewed in detail today,  See PACEART report  Assessment and Plan:  High-grade heart block  First degree AV block  Sinus node dysfunction hypertension  Pacemaker Medtronic The patient's device was interrogated.  The information was reviewed. No changes were made in the programming.    Given his minimal symptoms, I have elected to relieve his AV delay as is. His event that shortness of breath and symptoms I would decrease his AV delay the 200 ms range.  Blood pressures reasonably controlled       a

## 2013-06-11 NOTE — Patient Instructions (Addendum)
Your physician recommends that you continue on your current medications as directed. Please refer to the Current Medication list given to you today.  Remote monitoring is used to monitor your Pacemaker of ICD from home. This monitoring reduces the number of office visits required to check your device to one time per year. It allows [place] to keep an eye on the functioning of your device to ensure it is working properly. You are scheduled for a device check from home on September 10, 2013. You may send your transmission at any time that day. If you have a wireless device, the transmission will be sent automatically. After your physician reviews your transmission, you will receive a postcard with your next transmission date.  Your physician wants you to follow-up in:  1 year with Dr Caryl Comes.  You will receive a reminder letter in the mail two months in advance. If you don't receive a letter, please call our office to schedule the follow-up appointment.

## 2013-07-09 ENCOUNTER — Ambulatory Visit (INDEPENDENT_AMBULATORY_CARE_PROVIDER_SITE_OTHER): Payer: Medicare Other | Admitting: Internal Medicine

## 2013-07-09 ENCOUNTER — Other Ambulatory Visit (INDEPENDENT_AMBULATORY_CARE_PROVIDER_SITE_OTHER): Payer: Medicare Other

## 2013-07-09 ENCOUNTER — Encounter: Payer: Self-pay | Admitting: Internal Medicine

## 2013-07-09 VITALS — BP 110/70 | HR 82 | Temp 97.8°F | Ht 70.0 in | Wt 205.5 lb

## 2013-07-09 DIAGNOSIS — Z0000 Encounter for general adult medical examination without abnormal findings: Secondary | ICD-10-CM

## 2013-07-09 DIAGNOSIS — E785 Hyperlipidemia, unspecified: Secondary | ICD-10-CM

## 2013-07-09 LAB — BASIC METABOLIC PANEL
BUN: 22 mg/dL (ref 6–23)
CHLORIDE: 99 meq/L (ref 96–112)
CO2: 29 mEq/L (ref 19–32)
Calcium: 9 mg/dL (ref 8.4–10.5)
Creatinine, Ser: 1.3 mg/dL (ref 0.4–1.5)
GFR: 66.75 mL/min (ref >60.00)
Glucose, Bld: 87 mg/dL (ref 70–99)
POTASSIUM: 3.6 meq/L (ref 3.5–5.1)
Sodium: 134 mEq/L — ABNORMAL LOW (ref 135–145)

## 2013-07-09 LAB — URINALYSIS, ROUTINE W REFLEX MICROSCOPIC
Bilirubin Urine: NEGATIVE
Hgb urine dipstick: NEGATIVE
Ketones, ur: NEGATIVE
LEUKOCYTES UA: NEGATIVE
NITRITE: NEGATIVE
PH: 5.5 (ref 5.0–8.0)
RBC / HPF: NONE SEEN (ref 0–?)
SPECIFIC GRAVITY, URINE: 1.025 (ref 1.000–1.030)
Total Protein, Urine: NEGATIVE
UROBILINOGEN UA: 0.2 (ref 0.0–1.0)
Urine Glucose: NEGATIVE

## 2013-07-09 LAB — HEPATIC FUNCTION PANEL
ALT: 28 U/L (ref 0–53)
AST: 63 U/L — ABNORMAL HIGH (ref 0–37)
Albumin: 3.5 g/dL (ref 3.5–5.2)
Alkaline Phosphatase: 60 U/L (ref 39–117)
BILIRUBIN DIRECT: 0.1 mg/dL (ref 0.0–0.3)
Total Bilirubin: 0.5 mg/dL (ref 0.3–1.2)
Total Protein: 7.2 g/dL (ref 6.0–8.3)

## 2013-07-09 LAB — LIPID PANEL
CHOL/HDL RATIO: 3
Cholesterol: 144 mg/dL (ref 0–200)
HDL: 43.8 mg/dL (ref >39.00)
LDL Cholesterol: 81 mg/dL (ref 0–99)
Triglycerides: 98 mg/dL (ref 0.0–149.0)
VLDL: 19.6 mg/dL (ref 0.0–40.0)

## 2013-07-09 LAB — CBC WITH DIFFERENTIAL/PLATELET
Basophils Absolute: 0 10*3/uL (ref 0.0–0.1)
Basophils Relative: 0.4 % (ref 0.0–3.0)
EOS PCT: 2.2 % (ref 0.0–5.0)
Eosinophils Absolute: 0.1 10*3/uL (ref 0.0–0.7)
HCT: 38 % — ABNORMAL LOW (ref 39.0–52.0)
Hemoglobin: 13 g/dL (ref 13.0–17.0)
LYMPHS PCT: 23.8 % (ref 12.0–46.0)
Lymphs Abs: 1.4 10*3/uL (ref 0.7–4.0)
MCHC: 34.3 g/dL (ref 30.0–36.0)
MCV: 89.8 fl (ref 78.0–100.0)
MONOS PCT: 13 % — AB (ref 3.0–12.0)
Monocytes Absolute: 0.8 10*3/uL (ref 0.1–1.0)
NEUTROS PCT: 60.6 % (ref 43.0–77.0)
Neutro Abs: 3.5 10*3/uL (ref 1.4–7.7)
PLATELETS: 108 10*3/uL — AB (ref 150.0–400.0)
RBC: 4.23 Mil/uL (ref 4.22–5.81)
RDW: 14 % (ref 11.5–14.6)
WBC: 5.8 10*3/uL (ref 4.5–10.5)

## 2013-07-09 LAB — TSH: TSH: 0.58 u[IU]/mL (ref 0.35–5.50)

## 2013-07-09 NOTE — Progress Notes (Signed)
Pre visit review using our clinic review tool, if applicable. No additional management support is needed unless otherwise documented below in the visit note. 

## 2013-07-09 NOTE — Assessment & Plan Note (Signed)

## 2013-07-09 NOTE — Patient Instructions (Signed)

## 2013-07-09 NOTE — Progress Notes (Signed)
Subjective:    Patient ID: [name], male    DOB: Feb 03, 1926, 80 y.o.   MRN: [id number]  HPI  Here for wellness and f/u;  Overall doing ok;  Pt denies CP, worsening SOB, DOE, wheezing, orthopnea, PND, worsening LE edema, palpitations, dizziness or syncope.  Pt denies neurological change such as new headache, facial or extremity weakness.  Pt denies polydipsia, polyuria, or low sugar symptoms. Pt states overall good compliance with treatment and medications, good tolerability, and has been trying to follow lower cholesterol diet.  Pt denies worsening depressive symptoms, suicidal ideation or panic. No fever, night sweats, wt loss, loss of appetite, or other constitutional symptoms.  Pt states good ability with ADL's, has low fall risk, home safety reviewed and adequate, no other significant changes in hearing or vision, and only occasionally active with exercise.  No falls or current complaints. Past Medical History  Diagnosis Date  . Atrioventricular block, complete     intermittent  . Pacemaker     medtronic dual chamber  . Hepatotoxicity   . Lipoma   . DVT femoral (deep venous thrombosis) with thrombophlebitis     LUE  . Hyperlipidemia   . Hx of colonic polyps   . Erectile dysfunction   . BPH (benign prostatic hypertrophy)   . HTN (hypertension)   . Chest pain     a. 09/2012 Lexican Cardiolite: EF 57%, diaphragmatic atten of inf wall w/o ischemia.   Past Surgical History  Procedure Laterality Date  . Pacemaker insertion  04/06/08    Medtronic    reports that he has quit smoking. He does not have any smokeless tobacco history on file. He reports that he does not drink alcohol. His drug history is not on file. family history includes Arthritis in an other family member; Heart attack in an other family member. No Known Allergies Current Outpatient Prescriptions on File Prior to Visit  Medication Sig Dispense Refill  . amLODipine (NORVASC) 5 MG tablet Take 1 tablet (5 mg total)  by mouth daily.  30 tablet  6  . aspirin 81 MG tablet Take 81 mg by mouth daily.      Marland Kitchen atorvastatin (LIPITOR) 40 MG tablet Take 1 tablet (40 mg total) by mouth daily.  90 tablet  3  . lisinopril-hydrochlorothiazide (PRINZIDE,ZESTORETIC) 20-25 MG per tablet Take 1 tablet by mouth daily.  90 tablet  3  . timolol (BETIMOL) 0.5 % ophthalmic solution Place 1 drop into both eyes daily.       No current facility-administered medications on file prior to visit.   Review of Systems Constitutional: Negative for increased diaphoresis, other activity, appetite or other siginficant weight change  HENT: Negative for worsening hearing loss, ear pain, facial swelling, mouth sores and neck stiffness.   Eyes: Negative for other worsening pain, redness or visual disturbance.  Respiratory: Negative for shortness of breath and wheezing.   Cardiovascular: Negative for chest pain and palpitations.  Gastrointestinal: Negative for diarrhea, blood in stool, abdominal distention or other pain Genitourinary: Negative for hematuria, flank pain or change in urine volume.  Musculoskeletal: Negative for myalgias or other joint complaints.  Skin: Negative for color change and wound.  Neurological: Negative for syncope and numbness. other than noted Hematological: Negative for adenopathy. or other swelling Psychiatric/Behavioral: Negative for hallucinations, self-injury, decreased concentration or other worsening agitation.      Objective:   Physical Exam BP 110/70  Pulse 82  Temp(Src) 97.8 F (36.6 C) (Oral)  Ht 5\' 10"  (  1.778 m)  Wt 205 lb 8 oz (93.214 kg)  BMI 29.49 kg/m2  SpO2 97% VS noted,  Constitutional: Pt is oriented to person, place, and time. Appears well-developed and well-nourished.  Head: Normocephalic and atraumatic.  Right Ear: External ear normal.  Left Ear: External ear normal.  Nose: Nose normal.  Mouth/Throat: Oropharynx is clear and moist.  Eyes: Conjunctivae and EOM are normal. Pupils are  equal, round, and reactive to light.  Neck: Normal range of motion. Neck supple. No JVD present. No tracheal deviation present.  Cardiovascular: Normal rate, regular rhythm, normal heart sounds and intact distal pulses.   Pulmonary/Chest: Effort normal and breath sounds without rales or wheezing  Abdominal: Soft. Bowel sounds are normal. NT. No HSM  Musculoskeletal: Normal range of motion. Exhibits no edema.  Lymphadenopathy:  Has no cervical adenopathy.  Neurological: Pt is alert and oriented to person, place, and time. Pt has normal reflexes. No cranial nerve deficit. Motor grossly intact Skin: Skin is warm and dry. No rash noted.  Psychiatric:  Has normal mood and affect. Behavior is normal.      Assessment & Plan:

## 2013-09-10 ENCOUNTER — Encounter: Payer: Medicare Other | Admitting: *Deleted

## 2013-09-10 ENCOUNTER — Telehealth: Payer: Self-pay | Admitting: *Deleted

## 2013-09-10 ENCOUNTER — Telehealth: Payer: Self-pay | Admitting: Cardiology

## 2013-09-10 NOTE — Telephone Encounter (Signed)
Spoke with pt and reminded pt of remote transmission that is due today. Pt verbalized understanding.   

## 2013-09-10 NOTE — Telephone Encounter (Signed)
Gave pt instructions to send [employer]. Pt attempted x2 w/out success. Gave pt tech svcs #. Pt knows to call them from his cell phone for effective trouble shooting.

## 2013-09-14 ENCOUNTER — Encounter: Payer: Self-pay | Admitting: Cardiology

## 2013-10-20 ENCOUNTER — Encounter: Payer: Self-pay | Admitting: *Deleted

## 2013-10-26 ENCOUNTER — Ambulatory Visit (INDEPENDENT_AMBULATORY_CARE_PROVIDER_SITE_OTHER): Payer: Medicare Other | Admitting: *Deleted

## 2013-10-26 DIAGNOSIS — I442 Atrioventricular block, complete: Secondary | ICD-10-CM

## 2013-10-27 NOTE — Progress Notes (Signed)
Remote pacemaker transmission.   

## 2013-10-28 LAB — MDC_IDC_ENUM_SESS_TYPE_REMOTE
Battery Impedance: 1260 Ohm
Battery Remaining Longevity: 50 mo
Battery Voltage: 2.77 V
Brady Statistic AP VP Percent: 22 %
Brady Statistic AS VS Percent: 40 %
Date Time Interrogation Session: 20150817210335
Lead Channel Impedance Value: 429 Ohm
Lead Channel Pacing Threshold Pulse Width: 0.4 ms
Lead Channel Sensing Intrinsic Amplitude: 2.8 mV
Lead Channel Setting Pacing Amplitude: 2 V
Lead Channel Setting Pacing Pulse Width: 0.4 ms
MDC IDC MSMT LEADCHNL RA PACING THRESHOLD AMPLITUDE: 0.875 V
MDC IDC MSMT LEADCHNL RV IMPEDANCE VALUE: 940 Ohm
MDC IDC MSMT LEADCHNL RV PACING THRESHOLD AMPLITUDE: 1.25 V
MDC IDC MSMT LEADCHNL RV PACING THRESHOLD PULSEWIDTH: 0.4 ms
MDC IDC MSMT LEADCHNL RV SENSING INTR AMPL: 16 mV
MDC IDC SET LEADCHNL RV PACING AMPLITUDE: 2.5 V
MDC IDC SET LEADCHNL RV SENSING SENSITIVITY: 2 mV
MDC IDC STAT BRADY AP VS PERCENT: 3 %
MDC IDC STAT BRADY AS VP PERCENT: 35 %

## 2013-11-10 ENCOUNTER — Encounter: Payer: Self-pay | Admitting: Cardiology

## 2013-11-30 ENCOUNTER — Encounter: Payer: Self-pay | Admitting: Internal Medicine

## 2014-01-05 ENCOUNTER — Ambulatory Visit: Payer: Medicare Other

## 2014-01-07 ENCOUNTER — Ambulatory Visit (INDEPENDENT_AMBULATORY_CARE_PROVIDER_SITE_OTHER): Payer: Medicare Other | Admitting: *Deleted

## 2014-01-07 DIAGNOSIS — Z23 Encounter for immunization: Secondary | ICD-10-CM

## 2014-01-27 ENCOUNTER — Encounter: Payer: Medicare Other | Admitting: *Deleted

## 2014-01-27 ENCOUNTER — Telehealth: Payer: Self-pay | Admitting: Cardiology

## 2014-01-27 NOTE — Telephone Encounter (Signed)
Attempted to confirm remote transmission appt. With pt. The number has been disconnected.

## 2014-01-29 ENCOUNTER — Encounter: Payer: Self-pay | Admitting: Cardiology

## 2014-05-21 ENCOUNTER — Encounter: Payer: Self-pay | Admitting: *Deleted

## 2014-06-29 ENCOUNTER — Encounter: Payer: Self-pay | Admitting: Internal Medicine

## 2014-06-29 ENCOUNTER — Ambulatory Visit (INDEPENDENT_AMBULATORY_CARE_PROVIDER_SITE_OTHER): Payer: Medicare Other | Admitting: Internal Medicine

## 2014-06-29 VITALS — BP 130/56 | HR 86 | Ht 70.0 in | Wt 205.8 lb

## 2014-06-29 DIAGNOSIS — I441 Atrioventricular block, second degree: Secondary | ICD-10-CM | POA: Diagnosis not present

## 2014-06-29 DIAGNOSIS — I4891 Unspecified atrial fibrillation: Secondary | ICD-10-CM | POA: Diagnosis not present

## 2014-06-29 LAB — MDC_IDC_ENUM_SESS_TYPE_INCLINIC
Battery Voltage: 2.76 V
Brady Statistic AP VP Percent: 20 %
Brady Statistic AP VS Percent: 3 %
Brady Statistic AS VP Percent: 41 %
Brady Statistic AS VS Percent: 37 %
Date Time Interrogation Session: 20160419173914
Lead Channel Impedance Value: 945 Ohm
Lead Channel Pacing Threshold Amplitude: 0.75 V
Lead Channel Pacing Threshold Amplitude: 1 V
Lead Channel Pacing Threshold Pulse Width: 0.4 ms
Lead Channel Pacing Threshold Pulse Width: 0.4 ms
Lead Channel Sensing Intrinsic Amplitude: 4 mV
Lead Channel Sensing Intrinsic Amplitude: 5.6 mV
Lead Channel Setting Pacing Amplitude: 2 V
Lead Channel Setting Sensing Sensitivity: 2 mV
MDC IDC MSMT BATTERY IMPEDANCE: 1512 Ohm
MDC IDC MSMT BATTERY REMAINING LONGEVITY: 43 mo
MDC IDC MSMT LEADCHNL RA IMPEDANCE VALUE: 424 Ohm
MDC IDC SET LEADCHNL RV PACING AMPLITUDE: 2.5 V
MDC IDC SET LEADCHNL RV PACING PULSEWIDTH: 0.4 ms

## 2014-06-29 MED ORDER — LABETALOL HCL 100 MG PO TABS: 100.0000 mg | ORAL_TABLET | Freq: Two times a day (BID) | ORAL | Status: AC

## 2014-06-29 NOTE — Progress Notes (Signed)
PCP: Cathlean Cower, MD  [name] is a 81 y.o. male who presents today for routine electrophysiology followup.  He is status post pacemaker for high-grade heart block. He underwent generator replacement 2009    He has complaints of increasing fatigue with dyspnea on exertion. He has had some peripheral edema. He has no lightheadedness. He denies chest pain..     he is trying to decide whether to go to his [employer]  reunion in [place]     Echo 10/2012 demonstrated EF 55-60%, no RWMA, grade 1 diastolic dysfunction, mild MR.    .   Past Medical History  Diagnosis Date  . Atrioventricular block, complete     intermittent  . Pacemaker     medtronic dual chamber  . Hepatotoxicity   . Lipoma   . DVT femoral (deep venous thrombosis) with thrombophlebitis     LUE  . Hyperlipidemia   . Hx of colonic polyps   . Erectile dysfunction   . BPH (benign prostatic hypertrophy)   . HTN (hypertension)   . Chest pain     a. 09/2012 Lexican Cardiolite: EF 57%, diaphragmatic atten of inf wall w/o ischemia.   Past Surgical History  Procedure Laterality Date  . Pacemaker insertion  04/06/08    Medtronic    Current Outpatient Prescriptions  Medication Sig Dispense Refill  . amLODipine (NORVASC) 5 MG tablet Take 1 tablet (5 mg total) by mouth daily. 30 tablet 6  . aspirin 81 MG tablet Take 81 mg by mouth daily.    Marland Kitchen atorvastatin (LIPITOR) 40 MG tablet Take 1 tablet (40 mg total) by mouth daily. 90 tablet 3  . lisinopril-hydrochlorothiazide (PRINZIDE,ZESTORETIC) 20-25 MG per tablet Take 1 tablet by mouth daily. 90 tablet 3  . timolol (BETIMOL) 0.5 % ophthalmic solution Place 1 drop into both eyes daily.     No current facility-administered medications for this visit.    Physical Exam: Filed Vitals:   06/29/14 1628  BP: 130/56  Pulse: 86  Height: 5\' 10"  (1.778 m)  Weight: 205 lb 12.8 oz (93.35 kg)   Well developed and nourished in no acute distress HENT normal Neck supple with JVP  8 Clear Device pocket well healed; without hematoma or erythema.  There is no tethering  Regular rate and rhythm, no murmurs or gallops Abd-soft with active BS No Clubbing cyanosis 2+ edema Skin-warm and dry A & Oriented  Grossly normal sensory and motor function  ECG demonstrates  AV pacing with variable AV delays range in from 162 350 ms  ; They're frequent PVCs   Pacemaker interrogation- reviewed in detail today,  See PACEART report  Assessment and Plan:  High-grade heart block  First degree AV block  peripheral edema  Sinus node dysfunction   Hypertension  Pacemaker Medtronic The patient's device was interrogated.  The information was reviewed.      he has significant peripheral edema. I am not sure whether it is related to amlodipine or simply volume overload. We will presume firstly it is a former. We will discontinue his amlodipine use labetalol 100 mg twice daily as an alternative antihypertensive. He is also instructed to decrease his salt intake.   His AV delays remain  very long  And given his dyspnea, we will shorten his AV delay --200/180.         a

## 2014-06-29 NOTE — Patient Instructions (Signed)
Medication Instructions:  Your physician has recommended you make the following change in your medication:  Stop Norvasc. Start Labetalol 100 mg by mouth twice daily.   Labwork: none  Testing/Procedures: none  Follow-Up: Remote monitoring is used to monitor your Pacemaker of ICD from home. This monitoring reduces the number of office visits required to check your device to one time per year. It allows [place] to keep an eye on the functioning of your device to ensure it is working properly. You are scheduled for a device check from home on September 28, 2014 . You may send your transmission at any time that day. If you have a wireless device, the transmission will be sent automatically. After your physician reviews your transmission, you will receive a postcard with your next transmission date.  Your physician wants you to follow-up in: 12 months.  You will receive a reminder letter in the mail two months in advance. If you don't receive a letter, please call our office to schedule the follow-up appointment.   Any Other Special Instructions Will Be Listed Below (If Applicable).

## 2014-06-30 ENCOUNTER — Encounter: Payer: Self-pay | Admitting: Internal Medicine

## 2014-09-07 ENCOUNTER — Emergency Department (HOSPITAL_COMMUNITY): Payer: Medicare Other

## 2014-09-07 ENCOUNTER — Encounter (HOSPITAL_COMMUNITY): Payer: Self-pay | Admitting: Nurse Practitioner

## 2014-09-07 ENCOUNTER — Telehealth: Payer: Self-pay | Admitting: Internal Medicine

## 2014-09-07 ENCOUNTER — Inpatient Hospital Stay (HOSPITAL_COMMUNITY)
Admission: EM | Admit: 2014-09-07 | Discharge: 2014-09-15 | DRG: 293 | Disposition: A | Discharge status: Home / Self Care | Payer: Medicare Other | Attending: Internal Medicine | Admitting: Internal Medicine

## 2014-09-07 DIAGNOSIS — I272 Other secondary pulmonary hypertension: Secondary | ICD-10-CM | POA: Diagnosis present

## 2014-09-07 DIAGNOSIS — E876 Hypokalemia: Secondary | ICD-10-CM

## 2014-09-07 DIAGNOSIS — I5043 Acute on chronic combined systolic (congestive) and diastolic (congestive) heart failure: Principal | ICD-10-CM | POA: Diagnosis present

## 2014-09-07 DIAGNOSIS — I509 Heart failure, unspecified: Secondary | ICD-10-CM

## 2014-09-07 DIAGNOSIS — Z86718 Personal history of other venous thrombosis and embolism: Secondary | ICD-10-CM

## 2014-09-07 DIAGNOSIS — Z87891 Personal history of nicotine dependence: Secondary | ICD-10-CM

## 2014-09-07 DIAGNOSIS — Z006 Encounter for examination for normal comparison and control in clinical research program: Secondary | ICD-10-CM

## 2014-09-07 DIAGNOSIS — I5033 Acute on chronic diastolic (congestive) heart failure: Secondary | ICD-10-CM | POA: Diagnosis not present

## 2014-09-07 DIAGNOSIS — N400 Enlarged prostate without lower urinary tract symptoms: Secondary | ICD-10-CM | POA: Diagnosis present

## 2014-09-07 DIAGNOSIS — E785 Hyperlipidemia, unspecified: Secondary | ICD-10-CM | POA: Diagnosis present

## 2014-09-07 DIAGNOSIS — I501 Left ventricular failure: Secondary | ICD-10-CM | POA: Diagnosis not present

## 2014-09-07 DIAGNOSIS — Z950 Presence of cardiac pacemaker: Secondary | ICD-10-CM

## 2014-09-07 DIAGNOSIS — I442 Atrioventricular block, complete: Secondary | ICD-10-CM

## 2014-09-07 DIAGNOSIS — I081 Rheumatic disorders of both mitral and tricuspid valves: Secondary | ICD-10-CM | POA: Diagnosis present

## 2014-09-07 DIAGNOSIS — I10 Essential (primary) hypertension: Secondary | ICD-10-CM | POA: Diagnosis not present

## 2014-09-07 DIAGNOSIS — Z8249 Family history of ischemic heart disease and other diseases of the circulatory system: Secondary | ICD-10-CM

## 2014-09-07 DIAGNOSIS — Z7982 Long term (current) use of aspirin: Secondary | ICD-10-CM

## 2014-09-07 HISTORY — DX: Pneumonia, unspecified organism: J18.9

## 2014-09-07 HISTORY — DX: Unspecified osteoarthritis, unspecified site: M19.90

## 2014-09-07 LAB — I-STAT TROPONIN, ED: TROPONIN I, POC: 0.03 ng/mL (ref 0.00–0.08)

## 2014-09-07 LAB — BASIC METABOLIC PANEL
Anion gap: 7 (ref 5–15)
BUN: 8 mg/dL (ref 6–20)
CALCIUM: 8.5 mg/dL — AB (ref 8.9–10.3)
CO2: 23 mmol/L (ref 22–32)
Chloride: 108 mmol/L (ref 101–111)
Creatinine, Ser: 0.86 mg/dL (ref 0.61–1.24)
GFR calc Af Amer: >60 mL/min (ref >60)
GFR calc non Af Amer: >60 mL/min (ref >60)
GLUCOSE: 101 mg/dL — AB (ref 65–99)
Potassium: 3.6 mmol/L (ref 3.5–5.1)
SODIUM: 138 mmol/L (ref 135–145)

## 2014-09-07 LAB — TSH: TSH: 1.227 u[IU]/mL (ref 0.350–4.500)

## 2014-09-07 LAB — CBC
HEMATOCRIT: 35 % — AB (ref 39.0–52.0)
Hemoglobin: 12 g/dL — ABNORMAL LOW (ref 13.0–17.0)
MCH: 29.7 pg (ref 26.0–34.0)
MCHC: 34.3 g/dL (ref 30.0–36.0)
MCV: 86.6 fL (ref 78.0–100.0)
Platelets: 126 10*3/uL — ABNORMAL LOW (ref 150–400)
RBC: 4.04 MIL/uL — ABNORMAL LOW (ref 4.22–5.81)
RDW: 14.9 % (ref 11.5–15.5)
WBC: 6 10*3/uL (ref 4.0–10.5)

## 2014-09-07 LAB — BRAIN NATRIURETIC PEPTIDE: B Natriuretic Peptide: 601.5 pg/mL — ABNORMAL HIGH (ref 0.0–100.0)

## 2014-09-07 MED ORDER — LABETALOL HCL 100 MG PO TABS
100.0000 mg | ORAL_TABLET | Freq: Two times a day (BID) | ORAL | Status: DC
  Administered 2014-09-07 – 2014-09-15 (×16): 100 mg via ORAL
  Filled 2014-09-07 (×17): qty 1

## 2014-09-07 MED ORDER — HEPARIN SODIUM (PORCINE) 5000 UNIT/ML IJ SOLN
5000.0000 [IU] | Freq: Three times a day (TID) | INTRAMUSCULAR | Status: DC
  Administered 2014-09-07 – 2014-09-15 (×21): 5000 [IU] via SUBCUTANEOUS
  Filled 2014-09-07 (×26): qty 1

## 2014-09-07 MED ORDER — TIMOLOL MALEATE 0.5 % OP SOLN
1.0000 [drp] | Freq: Every day | OPHTHALMIC | Status: DC
  Administered 2014-09-07 – 2014-09-15 (×9): 1 [drp] via OPHTHALMIC
  Filled 2014-09-07: qty 5

## 2014-09-07 MED ORDER — ASPIRIN 81 MG PO TABS: 81.0000 mg | ORAL_TABLET | Freq: Every day | ORAL | Status: DC

## 2014-09-07 MED ORDER — TIMOLOL HEMIHYDRATE 0.5 % OP SOLN: 1.0000 [drp] | Freq: Every day | OPHTHALMIC | Status: DC

## 2014-09-07 MED ORDER — HYDRALAZINE HCL 20 MG/ML IJ SOLN
10.0000 mg | Freq: Once | INTRAMUSCULAR | Status: AC
  Administered 2014-09-07: 10 mg via INTRAVENOUS
  Filled 2014-09-07: qty 1

## 2014-09-07 MED ORDER — FUROSEMIDE 10 MG/ML IJ SOLN
60.0000 mg | Freq: Once | INTRAMUSCULAR | Status: AC
  Administered 2014-09-07: 60 mg via INTRAVENOUS
  Filled 2014-09-07: qty 6

## 2014-09-07 MED ORDER — ONDANSETRON HCL 4 MG/2ML IJ SOLN: 4.0000 mg | Freq: Four times a day (QID) | INTRAMUSCULAR | Status: DC | PRN

## 2014-09-07 MED ORDER — ATORVASTATIN CALCIUM 40 MG PO TABS
40.0000 mg | ORAL_TABLET | Freq: Every day | ORAL | Status: DC
  Administered 2014-09-07 – 2014-09-14 (×8): 40 mg via ORAL
  Filled 2014-09-07 (×9): qty 1

## 2014-09-07 MED ORDER — SODIUM CHLORIDE 0.9 % IJ SOLN: 3.0000 mL | INTRAMUSCULAR | Status: DC | PRN

## 2014-09-07 MED ORDER — SODIUM CHLORIDE 0.9 % IV SOLN: 250.0000 mL | INTRAVENOUS | Status: DC | PRN

## 2014-09-07 MED ORDER — ASPIRIN 81 MG PO CHEW
81.0000 mg | CHEWABLE_TABLET | Freq: Every day | ORAL | Status: DC
  Administered 2014-09-07 – 2014-09-15 (×9): 81 mg via ORAL
  Filled 2014-09-07 (×9): qty 1

## 2014-09-07 MED ORDER — FUROSEMIDE 10 MG/ML IJ SOLN
40.0000 mg | Freq: Two times a day (BID) | INTRAMUSCULAR | Status: DC
  Administered 2014-09-08 – 2014-09-09 (×4): 40 mg via INTRAVENOUS
  Filled 2014-09-07 (×7): qty 4

## 2014-09-07 MED ORDER — SODIUM CHLORIDE 0.9 % IJ SOLN
3.0000 mL | Freq: Two times a day (BID) | INTRAMUSCULAR | Status: DC
  Administered 2014-09-07 – 2014-09-15 (×16): 3 mL via INTRAVENOUS

## 2014-09-07 MED ORDER — ACETAMINOPHEN 325 MG PO TABS: 650.0000 mg | ORAL_TABLET | ORAL | Status: DC | PRN

## 2014-09-07 MED ORDER — LISINOPRIL 20 MG PO TABS
20.0000 mg | ORAL_TABLET | Freq: Every day | ORAL | Status: DC
  Administered 2014-09-07 – 2014-09-15 (×8): 20 mg via ORAL
  Filled 2014-09-07 (×9): qty 1

## 2014-09-07 NOTE — ED Notes (Addendum)
hes been c/o SOB and feeling like he can't move as well as normal this week. Wife called doctor and they advised her to bring him to ED for evaluation. He denies pain, cough, fevers. Wife is also concerned because his nose was bleeding on the way over here but it stopped now. A&Ox4, breathing even and unlabored

## 2014-09-07 NOTE — ED Notes (Signed)
Pt made aware of bed assignment 

## 2014-09-07 NOTE — ED Notes (Signed)
Attempted report x1. 

## 2014-09-07 NOTE — ED Notes (Addendum)
Cardiology at bedside.

## 2014-09-07 NOTE — Progress Notes (Signed)
Pt states inability to stand or ambulate upon arrival to floor. Bed weight obtained. Pt encouraged to let staff know whenever he thinks he will be able to stand. Will continue to monitor.

## 2014-09-07 NOTE — H&P (Signed)
CARDIOLOGY CONSULT NOTE   Patient ID: [name] MRN: [id number] DOB/AGE: [birth year] 81 y.o.  Admit date: 09/07/2014  Primary Physician   Cathlean Cower, MD Primary Cardiologist (EP):  Dr. Caryl Comes   Reason for Consultation  Dyspnea   HPI: [name] is a 81 y.o. male with a history of complete block s/p medtronic dual chamber pacemaker, HTN, HLD, femoral DVT and BPH who presented with worsening dyspnea.   Echo 10/2012 demonstrated EF 55-60%, no RWMA, grade 1 diastolic dysfunction, mild MR., PA peak pressure 61mm Hg.   Last seen by Dr. Caryl Comes 06/29/14. At that time, he was complaining of significant peripheral edema and dyspnea on exertion and his amolodine was discontinued, labetalol 100mg  BID added, advised to decrease his salt intake and shorten his AV delay to 200/180. He has been compliment to medication and diet however his exertional dyspnea getting worse since then. He called his doctor office today and reported the dyspnea so advised to be evaluated in the ED.He admites to have orthopnea and LE edema, however use a one pillow at night. He denies a chest pain, palpitation, PND, cough, fever, chills or claudications. Limited activity level.   In ED CXR showed vascular congestion and b/l pleural effusions. BNP of 601. POC trop is negative, Lytes normal. Cr normal. EKG with V spaced rhythm at rate of 97. He was given 60 mg lasix IV in ED and his breathing has improved.    Past Medical History  Diagnosis Date  . Atrioventricular block, complete     intermittent  . Pacemaker     medtronic dual chamber  . Hepatotoxicity   . Lipoma   . DVT femoral (deep venous thrombosis) with thrombophlebitis     LUE  . Hyperlipidemia   . Hx of colonic polyps   . Erectile dysfunction   . BPH (benign prostatic hypertrophy)   . HTN (hypertension)   . Chest pain     a. 09/2012 Lexican Cardiolite: EF 57%, diaphragmatic atten of inf wall w/o ischemia.     Past Surgical History  Procedure  Laterality Date  . Pacemaker insertion  04/06/08    Medtronic    No Known Allergies  I have reviewed the patient's current medications    Prior to Admission medications   Medication Sig Start Date End Date Taking? Authorizing Provider  aspirin 81 MG tablet Take 81 mg by mouth daily.   Yes Historical Provider, MD  atorvastatin (LIPITOR) 40 MG tablet Take 1 tablet (40 mg total) by mouth daily. 03/21/12  Yes Biagio Borg, MD  labetalol (NORMODYNE) 100 MG tablet Take 1 tablet (100 mg total) by mouth 2 (two) times daily. 06/29/14  Yes Deboraha Sprang, MD  lisinopril-hydrochlorothiazide (PRINZIDE,ZESTORETIC) 20-25 MG per tablet Take 1 tablet by mouth daily. 03/20/12  Yes Biagio Borg, MD  timolol (BETIMOL) 0.5 % ophthalmic solution Place 1 drop into both eyes daily.   Yes Historical Provider, MD     History   Social History  . Marital Status: Married    Spouse Name: N/A  . Number of Children: 4  . Years of Education: N/A   Occupational History  .     Social History Main Topics  . Smoking status: Former Research scientist (life sciences)  . Smokeless tobacco: Not on file  . Alcohol Use: No  . Drug Use: Not on file  . Sexual Activity: Not on file   Other Topics Concern  . Not on file   Social History Narrative  Family Status  Relation Status Death Age  . Mother Deceased   . Father Deceased   . Paternal Uncle Deceased    Family History  Problem Relation Age of Onset  . Arthritis    . Heart attack      uncle in his 52's  . Heart attack Paternal Uncle     60     ROS:  Full 14 point review of systems complete and found to be negative unless listed above.  Physical Exam: Blood pressure 163/96, pulse 98, temperature 97.8 F (36.6 C), temperature source Oral, resp. rate 22, SpO2 99 %.  General: Well developed, well nourished, male in no acute distress Head: Eyes PERRLA, No xanthomas. Normocephalic and atraumatic, oropharynx without edema or exudate. Poor dentition Lungs: Resp regular and unlabored.  Bibasilar rales.  Heart: RRR no s3, +s4, no murmurs. Neck: No carotid bruits. No lymphadenopathy. + JVD to jaw Abdomen: Bowel sounds present, abdomen soft and non-tender without masses or hernias noted. + distended.  Msk:  No spine or cva tenderness. No weakness, no joint deformities or effusions. Extremities: No clubbing, cyanosis. 2-3+ LE edema. Upper extremities have mild edema. Radials 2+ and equal bilaterally. LE skin is very tight.  Neuro: Alert and oriented X 3. No focal deficits noted. Psych:  Good affect, responds appropriately Skin: No rashes or lesions noted.  Labs:   Lab Results  Component Value Date   WBC 6.0 09/07/2014   HGB 12.0* 09/07/2014   HCT 35.0* 09/07/2014   MCV 86.6 09/07/2014   PLT 126* 09/07/2014   No results for input(s): INR in the last 72 hours.  Recent Labs Lab 09/07/14 1317  NA 138  K 3.6  CL 108  CO2 23  BUN 8  CREATININE 0.86  CALCIUM 8.5*  GLUCOSE 101*    Recent Labs  09/07/14 1322  TROPIPOC 0.03   Echo: 10/14/2012 ------------------------------------------------------------ LV EF: 55% -  60%  ------------------------------------------------------------ Indications:   3rd degree AV block complete 426.0. Atrial fibrillation - 427.31. Murmur 785.2.  ------------------------------------------------------------ History:  PMH: Acquired from the patient and from the patient's chart. Systolic murmur. Atrial fibrillation, with 3degrees AV block, which has been managed with permanent pacemaker implantation. Deep vein thrombosis. Risk factors: Hypertension. Dyslipidemia.  ------------------------------------------------------------ Study Conclusions  - Left ventricle: The cavity size was normal. Wall thickness was normal. Systolic function was normal. The estimated ejection fraction was in the range of 55% to 60%. Wall motion was normal; there were no regional wall motion abnormalities. Doppler parameters are  consistent with abnormal left ventricular relaxation (grade 1 diastolic dysfunction). - Aortic valve: There was no stenosis. - Mitral valve: Moderately calcified annulus. Mild regurgitation. - Left atrium: The atrium was mildly dilated. - Right ventricle: The cavity size was normal. Pacer wire or catheter noted in right ventricle. Systolic function was mildly reduced. - Tricuspid valve: Peak RV-RA gradient: 34mm Hg (S). - Pulmonary arteries: PA peak pressure: 42mm Hg (S). - Systemic veins: The IVC was not visualized. Impressions:  - Normal LV size and systolic function, EF 99-24%. Normal RV size with mildly decreased systolic function. Mild mitral regurgitation.   09/2012: MYOCARDIAL IMAGING WITH SPECT (REST AND PHARMACOLOGIC-STRESS) GATED LEFT VENTRICULAR WALL MOTION STUDY LEFT VENTRICULAR EJECTION FRACTION  Technique: Standard myocardial SPECT imaging was performed after resting intravenous injection of 10 mCi Tc-46m sestamibi. Subsequently, intravenous infusion of Lexiscan was performed under the supervision of the Cardiology staff. At peak effect of the drug, 30 mCi Tc-9m sestamibi was injected intravenously and  standard myocardial SPECT imaging was performed. Quantitative gated imaging was also performed to evaluate left ventricular wall motion, and estimate left ventricular ejection fraction.  Comparison: None  Findings: The SPECT stress and rest images demonstrate decreased activity in the inferior wall likely due to diaphragmatic attenuation. No reversible defects to suggest ischemia. The gated SPECT images demonstrate normal wall motion and myocardial thickening with contraction. The calculated ejection fraction was 57%.  IMPRESSION:  1. Diaphragmatic attenuation of the inferior wall. 2. No findings for myocardial ischemia. 3. Estimated ejection fraction 57%.   ECG:  Ventricular-paced rhythm at rate of 96.  Radiology:  Dg  Chest 2 View  09/07/2014   CLINICAL DATA:  Shortness of breath.  EXAM: CHEST  2 VIEW  COMPARISON:  September 29, 2012.  FINDINGS: The heart size and mediastinal contours are within normal limits. No pneumothorax is noted. Minimal bilateral pleural effusions are noted Left-sided pacemaker is unchanged in position. Stable elevation of right hemidiaphragm is noted. Increased central vascular congestion is noted as well as perihilar and basilar interstitial densities concerning for possible edema. The visualized skeletal structures are unremarkable.  IMPRESSION: Increased central pulmonary vascular congestion with associated perihilar and basilar interstitial densities concerning for edema. Minimal bilateral pleural effusions are noted.   Electronically Signed   By: Marijo Conception, M.D.   On: 09/07/2014 13:21     ASSESSMENT AND PLAN:     1. Acute CHF (systolic vs diastolic) - CXR with vascular congestion and bl pleural effusion. BNP of 601. Worsening dyspnea. Volume overload on exam.  - Echo 10/2012 demonstrated EF 55-60%, no RWMA, grade 1 diastolic dysfunction, mild MR., PA peak pressure 31mm Hg. - Given IV lasix 60mg  in ED. Breathing improved slightly.  - Will get echo - Continue Labetalol 100 bid, lisinopril 20mg  - Will hold HCTZ - Will give IV lasix 40mg  BID.  2. Hx of complete heart block s/p medtronic dual chamber pacemaker - Last device check 06/29/2014 showed at that time his AV delay shorten to 200/180.  - Will interrogate the device  3. HTN - BP of 171/103. Will give IV hydralazine now. Can increase lisinopril as needed.  4. HL - will get lipid panel -Continue statin  5. Hx of femoral DVT - heparin [place]  Signed: Bhagat,Bhavinkumar, PA 09/07/2014, 3:31 PM Pager [phone]  Co-Sign MD   Patient seen and examined with Vin Bhagat, PA-C. We discussed all aspects of the encounter. I agree with the assessment and plan as stated above. 81 y/o male presents with acute on chronic diastolic HF.  I performed bedside echo and EF preserved. Aov calcified but opens well. Will admit for IV diuresis - suspect he has al least 15-20 pounds on board. Not sure what triggered his flare but may need to look at device programming. Supp K as needed.   Bensimhon, Daniel,MD 5:15 PM

## 2014-09-07 NOTE — Telephone Encounter (Signed)
Patient Name: TORRYN HUDSPETH  DOB: 1925-04-21    Initial Comment Caller states her husband has aches all over and breathing heavy   Nurse Assessment  Nurse: Leilani Merl, RN, Heather Date/Time (Eastern Time): 09/07/2014 11:19:24 AM  Confirm and document reason for call. If symptomatic, describe symptoms. ---Caller states her husband has aches all over and breathing heavy, she is not sure if he has a fever. He has not been active for the last couple of days  Has the patient traveled out of the country within the last 30 days? ---Not Applicable  Does the patient require triage? ---Yes  Related visit to physician within the last 2 weeks? ---No  Does the PT have any chronic conditions? (i.e. diabetes, asthma, etc.) ---Yes  List chronic conditions. ---heart block, pacemaker     Guidelines    Guideline Title Affirmed Question Affirmed Notes  Breathing Difficulty [1] MODERATE difficulty breathing (e.g., speaks in phrases, SOB even at rest, pulse 100-120) AND [2] NEW-onset or WORSE than normal    Final Disposition User   Go to ED Now [place], RN, [employer]

## 2014-09-07 NOTE — Telephone Encounter (Signed)
Pt has gone to [facility] ED

## 2014-09-07 NOTE — ED Notes (Signed)
Ordered diet tray 

## 2014-09-07 NOTE — ED Provider Notes (Signed)
CSN: [id number]     Arrival date & time 09/07/14  1200 History   First MD Initiated Contact with Patient 09/07/14 1310     Chief Complaint  Patient presents with  . Shortness of Breath     (Consider location/radiation/quality/duration/timing/severity/associated sxs/prior Treatment) Patient is a 81 y.o. male presenting with shortness of breath. The history is provided by the patient and the spouse.  Shortness of Breath Severity:  Mild Onset quality:  Gradual Timing:  Constant Progression:  Worsening Chronicity:  New Relieved by:  Sitting up Associated symptoms: no chest pain, no cough, no fever and no vomiting      Mr. Kluver is a 81 yo M PMH high grade high block s/p permanent pacemaker, BPH, HTN and HLD with c/o SOB. His dyspnea has been ongoing for several months but getting worse for about the past month. He called his doctor office today and reported the dyspnea so advised to be evaluated in the ED.  He has been sitting up in his chair to sleep for the past month. He has noticed a decline in his activity level and interest level.  Reports to being compliant with his medications. Having some swelling in his legs more than usual.  His dyspnea is improved when he doesn't  Denies any cough, fever, chills, chest pain, constipation, diarrhea, dysuria or rash.    Past Medical History  Diagnosis Date  . Atrioventricular block, complete     intermittent  . Pacemaker     medtronic dual chamber  . Hepatotoxicity   . Lipoma   . DVT femoral (deep venous thrombosis) with thrombophlebitis     LUE  . Hyperlipidemia   . Hx of colonic polyps   . Erectile dysfunction   . BPH (benign prostatic hypertrophy)   . HTN (hypertension)   . Chest pain     a. 09/2012 Lexican Cardiolite: EF 57%, diaphragmatic atten of inf wall w/o ischemia.   Past Surgical History  Procedure Laterality Date  . Pacemaker insertion  04/06/08    Medtronic   Family History  Problem Relation Age of Onset  .  Arthritis    . Heart attack      uncle in his 43's  . Heart attack Paternal Uncle     60   History  Substance Use Topics  . Smoking status: Former Research scientist (life sciences)  . Smokeless tobacco: Not on file  . Alcohol Use: No    Review of Systems  Constitutional: Negative for fever and chills.  Respiratory: Positive for shortness of breath. Negative for cough.   Cardiovascular: Negative for chest pain.  Gastrointestinal: Negative for nausea, vomiting and constipation.  Genitourinary: Negative for dysuria.  Neurological: Negative for weakness and numbness.      Allergies  Review of patient's allergies indicates no known allergies.  Home Medications   Prior to Admission medications   Medication Sig Start Date End Date Taking? Authorizing Provider  aspirin 81 MG tablet Take 81 mg by mouth daily.   Yes Historical Provider, MD  atorvastatin (LIPITOR) 40 MG tablet Take 1 tablet (40 mg total) by mouth daily. 03/21/12  Yes Biagio Borg, MD  labetalol (NORMODYNE) 100 MG tablet Take 1 tablet (100 mg total) by mouth 2 (two) times daily. 06/29/14  Yes Deboraha Sprang, MD  lisinopril-hydrochlorothiazide (PRINZIDE,ZESTORETIC) 20-25 MG per tablet Take 1 tablet by mouth daily. 03/20/12  Yes Biagio Borg, MD  timolol (BETIMOL) 0.5 % ophthalmic solution Place 1 drop into both eyes daily.  Yes Historical Provider, MD   BP 171/107 mmHg  Pulse 98  Temp(Src) 97.8 F (36.6 C) (Oral)  Resp 20  SpO2 100% Physical Exam  Constitutional: He is oriented to person, place, and time. He appears well-developed and well-nourished.  HENT:  Head: Normocephalic and atraumatic.  Eyes: Conjunctivae and EOM are normal.  Neck: Normal range of motion.  Cardiovascular: Normal rate, regular rhythm and intact distal pulses.   No murmur heard. Pulmonary/Chest: Effort normal. He has no wheezes. He has rales.  Abdominal: Soft. There is no tenderness. There is no rebound.  Musculoskeletal: Normal range of motion. He exhibits edema.   Neurological: He is alert and oriented to person, place, and time.  Skin: Skin is warm.    ED Course  Procedures (including critical care time) Labs Review Labs Reviewed  BASIC METABOLIC PANEL - Abnormal; Notable for the following:    Glucose, Bld 101 (*)    Calcium 8.5 (*)    All other components within normal limits  BRAIN NATRIURETIC PEPTIDE - Abnormal; Notable for the following:    B Natriuretic Peptide 601.5 (*)    All other components within normal limits  CBC - Abnormal; Notable for the following:    RBC 4.04 (*)    Hemoglobin 12.0 (*)    HCT 35.0 (*)    Platelets 126 (*)    All other components within normal limits  Randolm Idol, ED    Imaging Review Dg Chest 2 View  09/07/2014   CLINICAL DATA:  Shortness of breath.  EXAM: CHEST  2 VIEW  COMPARISON:  September 29, 2012.  FINDINGS: The heart size and mediastinal contours are within normal limits. No pneumothorax is noted. Minimal bilateral pleural effusions are noted Left-sided pacemaker is unchanged in position. Stable elevation of right hemidiaphragm is noted. Increased central vascular congestion is noted as well as perihilar and basilar interstitial densities concerning for possible edema. The visualized skeletal structures are unremarkable.  IMPRESSION: Increased central pulmonary vascular congestion with associated perihilar and basilar interstitial densities concerning for edema. Minimal bilateral pleural effusions are noted.   Electronically Signed   By: Marijo Conception, M.D.   On: 09/07/2014 13:21     EKG Interpretation   Date/Time:  Tuesday September 07 2014 12:11:58 EDT Ventricular Rate:  96 PR Interval:    QRS Duration: 162 QT Interval:  462 QTC Calculation: 583 R Axis:   -52 Text Interpretation:  Ventricular-paced rhythm Abnormal ECG No significant  change since last tracing Confirmed by BEATON  MD, ROBERT ([id number]) on  09/07/2014 1:18:32 PM      Medications  furosemide (LASIX) injection 60 mg (60 mg  Intravenous Given 09/07/14 1440)    MDM   Final diagnoses:  None   Mr. Heaphy is a 81 yo M that is presenting with dyspnea. Imaging showing vascular congestion and b/l pleural effusions. Given 60 mg lasix IV. Called cardiology for consult.   Rosemarie Ax, MD PGY-2, [place] [facility] 09/07/2014, 3:42 PM      Rosemarie Ax, MD 09/07/14 1761  Leonard Schwartz, MD 09/07/14 [phone]

## 2014-09-08 ENCOUNTER — Ambulatory Visit (HOSPITAL_COMMUNITY): Payer: Medicare Other

## 2014-09-08 ENCOUNTER — Encounter (HOSPITAL_COMMUNITY): Payer: Self-pay | Admitting: General Practice

## 2014-09-08 ENCOUNTER — Other Ambulatory Visit (HOSPITAL_COMMUNITY): Payer: Medicare Other

## 2014-09-08 DIAGNOSIS — Z006 Encounter for examination for normal comparison and control in clinical research program: Secondary | ICD-10-CM | POA: Diagnosis not present

## 2014-09-08 DIAGNOSIS — I272 Other secondary pulmonary hypertension: Secondary | ICD-10-CM | POA: Diagnosis present

## 2014-09-08 DIAGNOSIS — Z86718 Personal history of other venous thrombosis and embolism: Secondary | ICD-10-CM | POA: Diagnosis not present

## 2014-09-08 DIAGNOSIS — I10 Essential (primary) hypertension: Secondary | ICD-10-CM | POA: Diagnosis not present

## 2014-09-08 DIAGNOSIS — E876 Hypokalemia: Secondary | ICD-10-CM | POA: Diagnosis not present

## 2014-09-08 DIAGNOSIS — I509 Heart failure, unspecified: Secondary | ICD-10-CM | POA: Diagnosis not present

## 2014-09-08 DIAGNOSIS — N400 Enlarged prostate without lower urinary tract symptoms: Secondary | ICD-10-CM | POA: Diagnosis present

## 2014-09-08 DIAGNOSIS — I501 Left ventricular failure: Secondary | ICD-10-CM | POA: Diagnosis present

## 2014-09-08 DIAGNOSIS — Z87891 Personal history of nicotine dependence: Secondary | ICD-10-CM | POA: Diagnosis not present

## 2014-09-08 DIAGNOSIS — I081 Rheumatic disorders of both mitral and tricuspid valves: Secondary | ICD-10-CM | POA: Diagnosis present

## 2014-09-08 DIAGNOSIS — Z8249 Family history of ischemic heart disease and other diseases of the circulatory system: Secondary | ICD-10-CM | POA: Diagnosis not present

## 2014-09-08 DIAGNOSIS — I5033 Acute on chronic diastolic (congestive) heart failure: Secondary | ICD-10-CM | POA: Diagnosis not present

## 2014-09-08 DIAGNOSIS — I5021 Acute systolic (congestive) heart failure: Secondary | ICD-10-CM

## 2014-09-08 DIAGNOSIS — E785 Hyperlipidemia, unspecified: Secondary | ICD-10-CM | POA: Diagnosis present

## 2014-09-08 DIAGNOSIS — I5043 Acute on chronic combined systolic (congestive) and diastolic (congestive) heart failure: Secondary | ICD-10-CM | POA: Diagnosis present

## 2014-09-08 DIAGNOSIS — Z7982 Long term (current) use of aspirin: Secondary | ICD-10-CM | POA: Diagnosis not present

## 2014-09-08 DIAGNOSIS — Z950 Presence of cardiac pacemaker: Secondary | ICD-10-CM | POA: Diagnosis not present

## 2014-09-08 LAB — LIPID PANEL
Cholesterol: 111 mg/dL (ref 0–200)
HDL: 46 mg/dL (ref >40)
LDL Cholesterol: 55 mg/dL (ref 0–99)
Total CHOL/HDL Ratio: 2.4 RATIO
Triglycerides: 51 mg/dL (ref <150)
VLDL: 10 mg/dL (ref 0–40)

## 2014-09-08 LAB — BASIC METABOLIC PANEL
Anion gap: 10 (ref 5–15)
BUN: 10 mg/dL (ref 6–20)
CO2: 28 mmol/L (ref 22–32)
Calcium: 8.7 mg/dL — ABNORMAL LOW (ref 8.9–10.3)
Chloride: 102 mmol/L (ref 101–111)
Creatinine, Ser: 0.99 mg/dL (ref 0.61–1.24)
GFR calc non Af Amer: >60 mL/min (ref >60)
Glucose, Bld: 76 mg/dL (ref 65–99)
POTASSIUM: 3.4 mmol/L — AB (ref 3.5–5.1)
SODIUM: 140 mmol/L (ref 135–145)

## 2014-09-08 NOTE — Progress Notes (Signed)
1800 resting comfofrtably [name] [name] [name]

## 2014-09-08 NOTE — Evaluation (Signed)
Physical Therapy Evaluation Patient Details Name: [name] MRN: [id number] DOB: May 29, 1925 Today's Date: 09/08/2014   History of Present Illness  Patient is a 81 y/o male with hx of high-grade AV block s/p medtronic dual chamber pacemaker, HTN, HLD, femoral DVT and BPH who presents with worsening dyspnea and CHF.   Clinical Impression  Patient presents with generalized weakness from hospitalization and dyspnea on exertion. Recommend use of RW for balance/safety. Pt reports he can get a RW from the [place]. Pt will have 24/7 S at home from family. Will continue to follow to maximize independence and mobility prior to return home.     Follow Up Recommendations Home health PT;Supervision - Intermittent    Equipment Recommendations  Rolling walker with 5" wheels (says he can get it from the [place]?)    Recommendations for Other Services       Precautions / Restrictions Precautions Precautions: Fall Restrictions Weight Bearing Restrictions: No      Mobility  Bed Mobility               General bed mobility comments: Sitting on BSC upon PT arrival.   Transfers Overall transfer level: Needs assistance Equipment used: Rolling walker (2 wheeled) Transfers: Sit to/from Stand Sit to Stand: Min guard         General transfer comment: Min guard for safety. Stood from [facility] x1, from chair x1. Transferred from [facility] to chair.  Ambulation/Gait Ambulation/Gait assistance: Min guard Ambulation Distance (Feet): 150 Feet Assistive device: Rolling walker (2 wheeled) Gait Pattern/deviations: Step-through pattern;Decreased stride length;Trunk flexed   Gait velocity interpretation: Below normal speed for age/gender General Gait Details: Slow, steady gait with use of RW. Cues for RW management. Dyspnea 2/4. Vitals stable.  Stairs            Wheelchair Mobility    Modified Rankin (Stroke Patients Only)       Balance Overall balance assessment: Needs assistance Sitting-balance  support: Feet supported;No upper extremity supported Sitting balance-Leahy Scale: Good     Standing balance support: During functional activity Standing balance-Leahy Scale: Fair                               Pertinent Vitals/Pain Pain Assessment: No/denies pain    Home Living Family/patient expects to be discharged to:: Private residence Living Arrangements: Spouse/significant other;Children Available Help at Discharge: Family;Available 24 hours/day Type of Home: House Home Access: Level entry     Home Layout: One level Home Equipment: None      Prior Function Level of Independence: Independent               Hand Dominance        Extremity/Trunk Assessment   Upper Extremity Assessment: Defer to OT evaluation           Lower Extremity Assessment: Generalized weakness         Communication   Communication: No difficulties  Cognition Arousal/Alertness: Awake/alert Behavior During Therapy: WFL for tasks assessed/performed Overall Cognitive Status: Within Functional Limits for tasks assessed                      General Comments      Exercises        Assessment/Plan    PT Assessment Patient needs continued PT services  PT Diagnosis Difficulty walking;Generalized weakness   PT Problem List Decreased strength;Decreased activity tolerance;Decreased mobility;Decreased balance;Decreased knowledge of use of DME  PT Treatment Interventions Balance training;Gait training;Functional mobility training;Therapeutic activities;Therapeutic exercise;Patient/family education;DME instruction   PT Goals (Current goals can be found in the Care Plan section) Acute Rehab PT Goals Patient Stated Goal: none stated PT Goal Formulation: With patient Time For Goal Achievement: 09/22/14 Potential to Achieve Goals: Fair    Frequency Min 3X/week   Barriers to discharge        Co-evaluation               End of Session Equipment Utilized  During Treatment: Gait belt Activity Tolerance: Patient tolerated treatment well Patient left: in chair;with call bell/phone within reach Nurse Communication: Mobility status         Time: 2162-4469 PT Time Calculation (min) (ACUTE ONLY): 18 min   Charges:   PT Evaluation $Initial PT Evaluation Tier I: 1 Procedure     PT G Codes:        [name] 09/08/2014, 2:51 PM Wray Kearns, [place], DPT [phone]

## 2014-09-08 NOTE — Evaluation (Addendum)
Occupational Therapy Evaluation Patient Details Name: [name] MRN: [id number] DOB: 04/06/1925 Today's Date: 09/08/2014    History of Present Illness 81 y.o. male with hx of high-grade AV block s/p medtronic dual chamber pacemaker, HTN, HLD, femoral DVT and BPH who presents with worsening dyspnea and CHF.    Clinical Impression   Pt admitted with above. Pt able to manage ADLs with increased time, PTA. Feel pt will benefit from acute OT to increase independence and activity tolerance prior to d/c.   Follow Up Recommendations  No OT follow up;Supervision - Intermittent    Equipment Recommendations  Tub bench versus shower seat; Other (comment) (AE-reacher, sockaid, long sponge, and long shoehorn)   Recommendations for Other Services       Precautions / Restrictions Precautions Precautions: Fall Restrictions Weight Bearing Restrictions: No      Mobility Bed Mobility Overal bed mobility: Modified Independent               Transfers Overall transfer level: Needs assistance Transfers: Sit to/from Stand Sit to Stand: Min guard         General transfer comment: cue to control descent for stand to sit transfer and cue for hand placement.    Balance Min guard for ambulation with RW. No LOB in session.                       ADL Overall ADL's : Needs assistance/impaired     Grooming: Set up;Sitting               Lower Body Dressing: Moderate assistance;Sit to/from stand   Toilet Transfer: Min guard;Ambulation;RW (sit to stand from chair)           Functional mobility during ADLs: Min guard;Rolling walker General ADL Comments: Educated on energy conservation techniques. Educated on options for shower chair and tub transfer technique. Educated on AE.       Vision  Pt wears glasses.   Perception     Praxis      Pertinent Vitals/Pain Pain Assessment: No/denies pain     Hand Dominance     Extremity/Trunk Assessment Upper  Extremity Assessment Upper Extremity Assessment: Generalized weakness   Lower Extremity Assessment Lower Extremity Assessment: Defer to PT evaluation       Communication Communication Communication: No difficulties   Cognition Arousal/Alertness: Awake/alert Behavior During Therapy: WFL for tasks assessed/performed Overall Cognitive Status: Within Functional Limits for tasks assessed                     General Comments       Exercises       Shoulder Instructions      Home Living Family/patient expects to be discharged to:: Private residence Living Arrangements: Spouse/significant other;Children Available Help at Discharge: Family;Available 24 hours/day Type of Home: House Home Access: Level entry     Home Layout: One level     Bathroom Shower/Tub: Teacher, early years/pre: Standard (sink close)     Home Equipment: None          Prior Functioning/Environment Level of Independence: Independent             OT Diagnosis: Generalized weakness   OT Problem List: Decreased activity tolerance;Decreased strength;Decreased range of motion;Decreased knowledge of use of DME or AE   OT Treatment/Interventions: Self-care/ADL training;Therapeutic exercise;Energy conservation;DME and/or AE instruction;Therapeutic activities;Patient/family education;Balance training    OT Goals(Current goals can be found in the care plan section)  Acute Rehab OT Goals Patient Stated Goal: not stated OT Goal Formulation: With patient Time For Goal Achievement: 09/15/14 Potential to Achieve Goals: Good ADL Goals Pt Will Perform Lower Body Bathing: with adaptive equipment;sit to/from stand;with supervision;with set-up Pt Will Perform Lower Body Dressing: with adaptive equipment;sit to/from stand;with set-up;with supervision Pt Will Transfer to Toilet: ambulating;with modified independence Pt Will Perform Tub/Shower Transfer: Tub transfer;with  supervision;ambulating;rolling walker (tub DME TBD) Additional ADL Goal #1: Pt will independently verbalize and demonstrate 3/3 energy conservation techniques.  OT Frequency: Min 2X/week   Barriers to D/C:            Co-evaluation              End of Session Equipment Utilized During Treatment: Gait belt;Rolling walker; back brace Nurse Communication: Other (comment) (catheter came off; pt in bed)  Activity Tolerance: Patient tolerated treatment well Patient left: in bed;with call bell/phone within reach   Time: 1559-1615 OT Time Calculation (min): 16 min Charges:  OT General Charges $OT Visit: 1 Procedure OT Evaluation $Initial OT Evaluation Tier I: 1 Procedure G-CodesBenito Mccreedy OTR/L [id number] 09/08/2014, 4:36 PM

## 2014-09-08 NOTE — Progress Notes (Signed)
Patient Profile: [name] is a 81 y.o. male with a history of high-grade AV block s/p medtronic dual chamber pacemaker, HTN, HLD, femoral DVT and BPH who presented with worsening dyspnea.   Subjective: Overall feeling about the same. Says his breathing has improved slightly from yesterday but he still has occasional dyspnea. No orthopnea or PND. No chest pain.   Objective: Vital signs in last 24 hours: Temp:  [97.8 F (36.6 C)-98 F (36.7 C)] 98 F (36.7 C) (06/29 8250) Pulse Rate:  [71-106] 74 (06/29 0633) Resp:  [13-25] 19 (06/29 0633) BP: (126-178)/(70-107) 126/77 mmHg (06/29 0633) SpO2:  [96 %-100 %] 100 % (06/29 5397) Weight:  [92.579 kg (204 lb 1.6 oz)-93.9 kg (207 lb 0.2 oz)] 93.9 kg (207 lb 0.2 oz) (06/29 0633) Last BM Date: 09/06/14  Intake/Output from previous day: 06/28 0701 - 06/29 0700 In: 480 [P.O.:480] Out: 2075 [Urine:2075] Intake/Output this shift:    Medications Current Facility-Administered Medications  Medication Dose Route Frequency Provider Last Rate Last Dose  . 0.9 %  sodium chloride infusion  250 mL Intravenous PRN Bhavinkumar Bhagat, PA      . acetaminophen (TYLENOL) tablet 650 mg  650 mg Oral Q4H PRN Bhavinkumar Bhagat, PA      . aspirin chewable tablet 81 mg  81 mg Oral Daily Jolaine Artist, MD   81 mg at 09/07/14 2150  . atorvastatin (LIPITOR) tablet 40 mg  40 mg Oral q1800 Bhavinkumar Bhagat, PA   40 mg at 09/07/14 2151  . furosemide (LASIX) injection 40 mg  40 mg Intravenous BID Bhavinkumar Bhagat, PA      . heparin injection 5,000 Units  5,000 Units Subcutaneous 3 times per day Leanor Kail, PA   5,000 Units at 09/08/14 0629  . labetalol (NORMODYNE) tablet 100 mg  100 mg Oral BID Bhavinkumar Bhagat, PA   100 mg at 09/07/14 2151  . lisinopril (PRINIVIL,ZESTRIL) tablet 20 mg  20 mg Oral Daily Bhavinkumar Bhagat, PA   20 mg at 09/07/14 2151  . ondansetron (ZOFRAN) injection 4 mg  4 mg Intravenous Q6H PRN Bhavinkumar Bhagat, PA       . sodium chloride 0.9 % injection 3 mL  3 mL Intravenous Q12H Bhavinkumar Bhagat, PA   3 mL at 09/07/14 2151  . sodium chloride 0.9 % injection 3 mL  3 mL Intravenous PRN Bhavinkumar Bhagat, PA      . timolol (TIMOPTIC) 0.5 % ophthalmic solution 1 drop  1 drop Both Eyes Daily Jolaine Artist, MD   1 drop at 09/07/14 2151    PE: General appearance: alert, cooperative, appears stated age and no distress Neck: +JVD 8 cm above sternal notch. no adenopathy, no carotid bruit, supple, symmetrical, trachea midline and thyroid not enlarged, symmetric, no tenderness/mass/nodules Lungs: rales bilaterally, LLL and RLL and poor air movement.  Heart: irregularly irregular rhythm, S1, S2 normal and no S3 or S4 Abdomen: abnormal findings:  distended and firm; nontender Extremities: edema 2+ pretibial edema bilaterally Pulses: 2+ and symmetric Skin: Skin color, texture, turgor normal. No rashes or lesions Neurologic: Grossly normal  Lab Results:   Recent Labs  09/07/14 1317  WBC 6.0  HGB 12.0*  HCT 35.0*  PLT 126*   BMET  Recent Labs  09/07/14 1317 09/08/14 0344  NA 138 140  K 3.6 3.4*  CL 108 102  CO2 23 28  GLUCOSE 101* 76  BUN 8 10  CREATININE 0.86 0.99  CALCIUM 8.5* 8.7*   Cholesterol  Recent Labs  09/08/14 0344  CHOL 111    EKG: V-paced, rate 96.   Studies/Results: 09/07/14 CXR: Increased central pulmonary vascular congestion with associated perihilar and basilar interstitial densities concerning for edema. Minimal bilateral pleural effusions are noted.  Assessment/Plan Principal Problem:   Acute on chronic diastolic congestive heart failure Active Problems:   Hypertension   Heart failure    1. Acute on chronic diastolic CHF:  - BNP elevated at 601. Wt is 93.9 kg; was previously 93 kg at outpatient visit in April.   - I/O net negative -1.595 L. Echo pending, however per Dr. Haroldine Laws, bedside echo showed preserved EF  - still fluid overloaded with +JVD  and LE edema (LE edema looks woody), continue IV diuresis and monitor Cr  2. HTN:  - Improving with diuresis. No adjustments at this time.   3. Hypokalemia:  - Replace with K-dur 40 mEq PO x1.   - Recheck BMET in AM.   4. H/o complete heart block s/p dual chamber PPM:  - AV delay shortened from  350/361ms to 180/110ms on 06/29/14 per Dr. Caryl Comes.   - Device interrogated this morning, pacing and sensing correction, battery life good. All device parameters normal. Detection were set to record atrial high rates faster than 175bpm and ventricular high rates faster than 180s, both has been reset to 150bpm in both chambers  5. Hx of femoral DVT  Hervey Ard, NP student. Patient seen with Almyra Deforest PA-C, physical exam performed separately, chart reviewed and discussed.   09/08/2014 9:11 AM  Signed, Almyra Deforest PA Pager: [phone]  Personally seen and examined. Agree with above. Continue diuresis.  As above.  Candee Furbish, MD

## 2014-09-08 NOTE — Research (Signed)
REDs_0  Informed Consent   Subject Name: [name]  Subject met inclusion and exclusion criteria.  The informed consent form, study requirements and expectations were reviewed with the subject and questions and concerns were addressed prior to the signing of the consent form.  The subject verbalized understanding of the trail requirements.  The subject agreed to participate in the REDS_1  trial and signed the informed consent.  The informed consent was obtained prior to performance of any protocol-specific procedures for the subject.  A copy of the signed informed consent was given to the subject and a copy was placed in the subject's medical record.  Sandie Ano 09/08/2014, 3:00pm

## 2014-09-08 NOTE — Care Management Note (Signed)
Case Management Note  Patient Details  Name: [name] MRN: [id number] Date of Birth: 1926-01-08  Subjective/Objective: Admitted with CHF                  Action/Plan: Patient lives at home with spouse, independent of ADL's. PCP is Dr Cathlean Cower and he goes to the [facility] in [place] every 6 months for a check up. He has Emergency planning/management officer with prescription drug coverage, pharmacy of choice is [facility]. (patient stated " my medicine is free through the [facility].") Patient has scales at home but does not weigh himself daily. CM encouraged patient to weigh himself daily for close monitoring of his weight. He also states that his spouse cooks healthy without salt and he does not eat out a lot.He does not exercise but stated that he is going to try walking more. Very active in his [place].  Expected Discharge Date:    possibly 09/12/2014              Expected Discharge Plan:  Home/Self Care  Discharge planning Services  CM Consult    Status of Service:  In process, will continue to follow   Sherrilyn Rist [phone] 09/08/2014, 11:14 AM

## 2014-09-08 NOTE — Progress Notes (Signed)
  Echocardiogram 2D Echocardiogram has been performed.  [name] 09/08/2014, 1:57 PM

## 2014-09-08 NOTE — Progress Notes (Signed)
Heart Failure Navigator Consult Note  Presentation: [name] is a 81 y.o. male with a history of complete block s/p medtronic dual chamber pacemaker, HTN, HLD, femoral DVT and BPH who presented with worsening dyspnea.   Past Medical History  Diagnosis Date  . Atrioventricular block, complete     intermittent  . Pacemaker     medtronic dual chamber  . Hepatotoxicity   . Lipoma     pt unaware of this on 09/08/2014  . DVT femoral (deep venous thrombosis) with thrombophlebitis     LUE  . Hyperlipidemia   . Hx of colonic polyps   . Erectile dysfunction   . BPH (benign prostatic hypertrophy)   . HTN (hypertension)   . Chest pain     a. 09/2012 [facility] Cardiolite: EF 57%, diaphragmatic atten of inf wall w/o ischemia.  . Pneumonia 1929  . Arthritis     "it's beginning to come on now" (09/08/2014)    History   Social History  . Marital Status: Married    Spouse Name: N/A  . Number of Children: 4  . Years of Education: N/A   Occupational History  .     Social History Main Topics  . Smoking status: Former Smoker -- 5 years    Types: Cigarettes  . Smokeless tobacco: Never Used     Comment: "quit smoking cigarettes in 1968"  . Alcohol Use: Yes     Comment: 09/08/2014 "stopped drinking in 1956  . Drug Use: Not on file  . Sexual Activity: Not Currently   Other Topics Concern  . None   Social History Narrative    ECHO:Study Conclusions  - Left ventricle: The cavity size was normal. There was mild focal basal and mild concentric hypertrophy of the septum. Systolic function was mildly reduced. The estimated ejection fraction was in the range of 45% to 50%. Diffuse hypokinesis. Features are consistent with a pseudonormal left ventricular filling pattern, with concomitant abnormal relaxation and increased filling pressure (grade 2 diastolic dysfunction). Doppler parameters are consistent with elevated ventricular end-diastolic filling pressure. -  Aortic valve: Trileaflet; mildly thickened, mildly calcified leaflets. Transvalvular velocity was minimally increased. There was no stenosis. There was no regurgitation. - Mitral valve: Mildly thickened, moderately calcified leaflets . There was moderate regurgitation. - Left atrium: The atrium was normal in size. - Right ventricle: Systolic function was normal. - Right atrium: Pacer wire or catheter noted in right atrium. - Tricuspid valve: There was moderate regurgitation. - Pulmonary arteries: Systolic pressure was moderately increased. PA peak pressure: 51 mm Hg (S).  Impressions:  - LVEF appears midly impaired, the estimate might be affected by poorly visualized endocardium and frequent PVCs. Filling pressures are elevated. Moderate mitral and tricuspid regurgitation. There is moderate pulmonary hypertension.  BNP    Component Value Date/Time   BNP 601.5* 09/07/2014 1317    ProBNP    Component Value Date/Time   PROBNP 421.8 09/29/2012 2230     Education Assessment and Provision:  Detailed education and instructions provided on heart failure disease management including the following:  Signs and symptoms of Heart Failure When to call the physician Importance of daily weights Low sodium diet Fluid restriction Medication management Anticipated future follow-up appointments  Patient education given on each of the above topics.  Patient acknowledges understanding and acceptance of all instructions.  I spoke with Mr. Hamre about his HF diagnosis.  He tells me that he has a scale and that he does not weigh daily.  I  discussed the importance of daily weights and how to use those as a tool in relation to signs and symptoms of HF.  He tells me that no one had explained that to him before.  We reviewed a low sodium diet and high sodium foods to avoid.  He gets all meds from the [facility] and denies any issue with getting or taking as prescribed.  I have encouraged  him to call me with any questions related to his HF after discharge from the hospital.  He will follow outpatient with [facility].  Education Materials:  "Living Better With Heart Failure" Booklet, Daily Weight Tracker Tool    High Risk Criteria for Readmission and/or Poor Patient Outcomes:   EF <30%- No 45-50% with grade 2 dias dys  2 or more admissions in 6 months- Yes  Difficult social situation- No  Demonstrates medication noncompliance- No    Barriers of Care:  Knowledge and compliance  Discharge Planning:   Plans to return home with wife.  He would benefit from [facility] for ongoing symptom recognition as well as compliance reinforcement.

## 2014-09-09 LAB — BASIC METABOLIC PANEL
Anion gap: 8 (ref 5–15)
BUN: 13 mg/dL (ref 6–20)
CHLORIDE: 101 mmol/L (ref 101–111)
CO2: 29 mmol/L (ref 22–32)
Calcium: 8.8 mg/dL — ABNORMAL LOW (ref 8.9–10.3)
Creatinine, Ser: 1.02 mg/dL (ref 0.61–1.24)
GFR calc Af Amer: >60 mL/min (ref >60)
Glucose, Bld: 91 mg/dL (ref 65–99)
Potassium: 3.2 mmol/L — ABNORMAL LOW (ref 3.5–5.1)
SODIUM: 138 mmol/L (ref 135–145)

## 2014-09-09 MED ORDER — POTASSIUM CHLORIDE CRYS ER 20 MEQ PO TBCR
40.0000 meq | EXTENDED_RELEASE_TABLET | Freq: Once | ORAL | Status: AC
  Administered 2014-09-09: 40 meq via ORAL
  Filled 2014-09-09: qty 2

## 2014-09-09 NOTE — Progress Notes (Signed)
Occupational Therapy Treatment Patient Details Name: Robi Dewolfe MRN: [id number] DOB: [birth year] Today's Date: 09/09/2014    History of present illness 81 y.o. male with hx of high-grade AV block s/p medtronic dual chamber pacemaker, HTN, HLD, femoral DVT and BPH who presents with worsening dyspnea and CHF.    OT comments  Pt focused on urinating.  Requires encouragement for OOB.  Pt reports a sedentary lifestyle.  Educated in energy conservation related to ADL and provided instruction in use of AE for LB ADL and benefits of seated showering.  Follow Up Recommendations  No OT follow up;Supervision - Intermittent    Equipment Recommendations       Recommendations for Other Services      Precautions / Restrictions Precautions Precautions: Fall       Mobility Bed Mobility Overal bed mobility: Modified Independent                Transfers                      Balance                                   ADL Overall ADL's : Needs assistance/impaired     Grooming: Min guard;Standing         Lower Body Bathing Details (indicate cue type and reason): pt is knowledgeable in use and availability of long handled sponge       Lower Body Dressing Details (indicate cue type and reason): demonstrated use of AE, pt declined practicing with it Toilet Transfer: Min guard;Ambulation;RW   Toileting- Water quality scientist and Hygiene: Min guard;Sit to/from stand       Functional mobility during ADLs: Min guard;Rolling walker General ADL Comments: Provided and reviewed energy conservation handout. Reinforced benefits of sitting to shower.      Vision                     Perception     Praxis      Cognition   Behavior During Therapy: WFL for tasks assessed/performed Overall Cognitive Status: No family/caregiver present to determine baseline cognitive functioning       Memory: Decreased short-term memory                Extremity/Trunk Assessment               Exercises     Shoulder Instructions       General Comments      Pertinent Vitals/ Pain       Pain Assessment: No/denies pain  Home Living                                          Prior Functioning/Environment              Frequency Min 2X/week     Progress Toward Goals  OT Goals(current goals can now be found in the care plan section)  Progress towards OT goals: Progressing toward goals  Acute Rehab OT Goals Patient Stated Goal: wants to get fluid off Time For Goal Achievement: 09/15/14 Potential to Achieve Goals: Good  Plan Discharge plan remains appropriate    Co-evaluation                 End of Session Equipment Utilized  During Treatment: Gait belt;Rolling walker   Activity Tolerance Patient limited by fatigue   Patient Left in bed;with call bell/phone within reach;with bed alarm set   Nurse Communication          Time: [phone] OT Time Calculation (min): 15 min  Charges: OT General Charges $OT Visit: 1 Procedure OT Treatments $Self Care/Home Management : 8-22 mins  Malka So 09/09/2014, 9:31 AM  [phone]

## 2014-09-09 NOTE — Progress Notes (Signed)
Patient Profile: 81 y.o. male with a history of high-grade AV block s/p medtronic dual chamber pacemaker, HTN, HLD, femoral DVT and BPH who presented with worsening dyspnea.  Subjective: Breathing ok at rest. Hasn't ambulated much since admission, so can't fully comment on any improvement regarding exertional dyspnea. No chest pain.   Objective: Vital signs in last 24 hours: Temp:  [97.6 F (36.4 C)-97.9 F (36.6 C)] 97.9 F (36.6 C) (06/30 0208) Pulse Rate:  [70-72] 70 (06/30 0208) Resp:  [16-18] 16 (06/30 0208) BP: (101-140)/(49-80) 129/66 mmHg (06/30 0208) SpO2:  [98 %-100 %] 98 % (06/30 0208) Last BM Date: 09/06/14  Intake/Output from previous day: 06/29 0701 - 06/30 0700 In: 1300 [P.O.:1300] Out: 1450 [Urine:1450] Intake/Output this shift:    Medications Current Facility-Administered Medications  Medication Dose Route Frequency Provider Last Rate Last Dose  . 0.9 %  sodium chloride infusion  250 mL Intravenous PRN Bhavinkumar Bhagat, PA      . acetaminophen (TYLENOL) tablet 650 mg  650 mg Oral Q4H PRN Bhavinkumar Bhagat, PA      . aspirin chewable tablet 81 mg  81 mg Oral Daily Jolaine Artist, MD   81 mg at 09/08/14 1038  . atorvastatin (LIPITOR) tablet 40 mg  40 mg Oral q1800 Bhavinkumar Bhagat, PA   40 mg at 09/08/14 1801  . furosemide (LASIX) injection 40 mg  40 mg Intravenous BID Bhavinkumar Bhagat, PA   40 mg at 09/08/14 1800  . heparin injection 5,000 Units  5,000 Units Subcutaneous 3 times per day Leanor Kail, PA   5,000 Units at 09/09/14 3825  . labetalol (NORMODYNE) tablet 100 mg  100 mg Oral BID Bhavinkumar Bhagat, PA   100 mg at 09/08/14 2150  . lisinopril (PRINIVIL,ZESTRIL) tablet 20 mg  20 mg Oral Daily Bhavinkumar Bhagat, PA   20 mg at 09/08/14 1038  . ondansetron (ZOFRAN) injection 4 mg  4 mg Intravenous Q6H PRN Bhavinkumar Bhagat, PA      . sodium chloride 0.9 % injection 3 mL  3 mL Intravenous Q12H Bhavinkumar Bhagat, PA   3 mL at 09/08/14  2150  . sodium chloride 0.9 % injection 3 mL  3 mL Intravenous PRN Bhavinkumar Bhagat, PA      . timolol (TIMOPTIC) 0.5 % ophthalmic solution 1 drop  1 drop Both Eyes Daily Jolaine Artist, MD   1 drop at 09/08/14 1039   Filed Weights   09/07/14 2016 09/08/14 0633  Weight: 204 lb 1.6 oz (92.579 kg) 207 lb 0.2 oz (93.9 kg)    PE: General appearance: alert, cooperative and no distress Neck: no carotid bruit; mildly elevated JVD Lungs: faint bibasilar crackles Heart: regular rate and rhythm Extremities: trace bilateral LEE R>L Pulses: 2+ and symmetric Skin: warm and dry Neurologic: Grossly normal  Lab Results:   Recent Labs  09/07/14 1317  WBC 6.0  HGB 12.0*  HCT 35.0*  PLT 126*   BMET  Recent Labs  09/07/14 1317 09/08/14 0344 09/09/14 0408  NA 138 140 138  K 3.6 3.4* 3.2*  CL 108 102 101  CO2 23 28 29   GLUCOSE 101* 76 91  BUN 8 10 13   CREATININE 0.86 0.99 1.02  CALCIUM 8.5* 8.7* 8.8*   PT/INR No results for input(s): LABPROT, INR in the last 72 hours. Cholesterol  Recent Labs  09/08/14 0344  CHOL 111    2D Echo: Study Conclusions  - Left ventricle: The cavity size was normal. There was mild focal  basal and mild concentric hypertrophy of the septum. Systolic function was mildly reduced. The estimated ejection fraction was in the range of 45% to 50%. Diffuse hypokinesis. Features are consistent with a pseudonormal left ventricular filling pattern, with concomitant abnormal relaxation and increased filling pressure (grade 2 diastolic dysfunction). Doppler parameters are consistent with elevated ventricular end-diastolic filling pressure. - Aortic valve: Trileaflet; mildly thickened, mildly calcified leaflets. Transvalvular velocity was minimally increased. There was no stenosis. There was no regurgitation. - Mitral valve: Mildly thickened, moderately calcified leaflets . There was moderate regurgitation. - Left atrium: The  atrium was normal in size. - Right ventricle: Systolic function was normal. - Right atrium: Pacer wire or catheter noted in right atrium. - Tricuspid valve: There was moderate regurgitation. - Pulmonary arteries: Systolic pressure was moderately increased. PA peak pressure: 51 mm Hg (S).  Impressions:  - LVEF appears midly impaired, the estimate might be affected by poorly visualized endocardium and frequent PVCs. Filling pressures are elevated. Moderate mitral and tricuspid regurgitation. There is moderate pulmonary hypertension.   Assessment/Plan  Principal Problem:   Acute on chronic diastolic congestive heart failure Active Problems:   Hypertension   Heart failure   Hypokalemia   1. Acute on chronic diastolic CHF:  - LEE improved but still with faint bibasilar crackles           - Continue IV Lasix this am with possible conversion to PO later today           - I/Os net negative 1.7L           - BP and renal function stable. Will supplement K.            - low sodium diet.   2. Systolic Dysfunction:   - EF now 45-50%, down from 55-60% on previous echo 10/2012.  - consider NST to assess for myocardial ischemia  - continue BB and ACE-I therapy  2. HTN: -Well controlled this am at 129/66.  - continue labetalol, lisinopril and lasix.   3. Hypokalemia:   - K at 3.2 today. - Replace with K-dur 40 mEq PO x1.  - Recheck BMET in AM.   4. H/o complete heart block s/p dual chamber PPM: - AV delay shortened from 350/338ms to 180/161ms on 06/29/14 per Dr. Caryl Comes.  - Device interrogated this morning, pacing and sensing correction, battery life good. All device parameters normal. Detection was set to record atrial high rates faster than 175bpm and ventricular high rates faster than 180s, both has been reset to 150bpm in both chambers  5. Hx of femoral DVT:  - heparin injections for VTE  prophylaxis     LOS: 1 day    Brittainy M. Rosita Fire, PA-C 09/09/2014 8:07 AM  Personally seen and examined. Agree with above. Continue with diuresis Looks better Replete K Pacer  Candee Furbish, MD

## 2014-09-09 NOTE — Progress Notes (Signed)
Physical Therapy Treatment Patient Details Name: [name] MRN: [id number] DOB: [birth year] Today's Date: 09/09/2014    History of Present Illness 81 y.o. male with hx of high-grade AV block s/p medtronic dual chamber pacemaker, HTN, HLD, femoral DVT and BPH who presents with worsening dyspnea and CHF.     PT Comments    Pt able to increase gait distance today with no visible SOB.  Recommend HHPT and RW for home use.  Follow Up Recommendations  Home health PT;Supervision - Intermittent     Equipment Recommendations  Rolling walker with 5" wheels    Recommendations for Other Services       Precautions / Restrictions Precautions Precautions: Fall Restrictions Weight Bearing Restrictions: No    Mobility  Bed Mobility Overal bed mobility: Needs Assistance Bed Mobility: Supine to Sit     Supine to sit: Min guard     General bed mobility comments: Worked with bed flat to simulate home, but pt relying heavily on rail, but no physical A from PT.  Transfers Overall transfer level: Needs assistance Equipment used: Rolling walker (2 wheeled) Transfers: Sit to/from Stand Sit to Stand: Min guard         General transfer comment: cues for hand placement  Ambulation/Gait Ambulation/Gait assistance: Min guard Ambulation Distance (Feet): 220 Feet Assistive device: Rolling walker (2 wheeled) Gait Pattern/deviations: Step-through pattern Gait velocity: decreased   General Gait Details: Some cues for RW management, but overall did well with it.   Stairs            Wheelchair Mobility    Modified Rankin (Stroke Patients Only)       Balance     Sitting balance-Leahy Scale: Good       Standing balance-Leahy Scale: Fair                      Cognition Arousal/Alertness: Awake/alert Behavior During Therapy: WFL for tasks assessed/performed Overall Cognitive Status: No family/caregiver present to determine baseline cognitive functioning       Memory: Decreased short-term memory              Exercises      General Comments General comments (skin integrity, edema, etc.): Pt wears back brace for comfort/support      Pertinent Vitals/Pain Pain Assessment: No/denies pain    Home Living                      Prior Function            PT Goals (current goals can now be found in the care plan section) Acute Rehab PT Goals PT Goal Formulation: With patient Time For Goal Achievement: 09/22/14 Potential to Achieve Goals: Good Progress towards PT goals: Progressing toward goals    Frequency  Min 3X/week    PT Plan Current plan remains appropriate    Co-evaluation             End of Session Equipment Utilized During Treatment: Gait belt Activity Tolerance: Patient tolerated treatment well Patient left: Other (comment);with call bell/phone within reach (on [facility] )     Time: 1222-1239 PT Time Calculation (min) (ACUTE ONLY): 17 min  Charges:  $Gait Training: 8-22 mins                    G Codes:      Rael Yo LUBECK 09/09/2014, 1:18 PM

## 2014-09-09 NOTE — Progress Notes (Signed)
09/09/2014 Talked patient about [place] choices, patient chose [facility] for [facility]. Sheppard Evens with [facility] called for arrangements.  Case Management Note  Patient Details  Name: [name] MRN: [id number] Date of Birth: 12-04-1925  Subjective/Objective: Admitted with CHF    Action/Plan: 6/29/2-16 Patient lives at home with spouse, independent of ADL's. PCP is Dr Cathlean Cower and he goes to the [facility] in [place] every 6 months for a check up. He has Emergency planning/management officer with prescription drug coverage, pharmacy of choice is [facility]. (patient stated " my medicine is free through the [facility].") Patient has scales at home but does not weigh himself daily. CM encouraged patient to weigh himself daily for close monitoring of his weight. He also states that his spouse cooks healthy without salt and he does not eat out a lot.He does not exercise but stated that he is going to try walking more. Very active in his [place].  Expected Discharge Date:   possibly 09/12/2014   Expected Discharge Plan: Home with [facility] services  Discharge planning Services CM Consult   Status of Service: In process, will continue to follow   Sherrilyn Rist [phone] 09/08/2014, 11:14 AM

## 2014-09-10 DIAGNOSIS — Z950 Presence of cardiac pacemaker: Secondary | ICD-10-CM

## 2014-09-10 LAB — BASIC METABOLIC PANEL
ANION GAP: 8 (ref 5–15)
BUN: 17 mg/dL (ref 6–20)
CO2: 29 mmol/L (ref 22–32)
Calcium: 9.1 mg/dL (ref 8.9–10.3)
Chloride: 103 mmol/L (ref 101–111)
Creatinine, Ser: 1.01 mg/dL (ref 0.61–1.24)
GFR calc Af Amer: >60 mL/min (ref >60)
GFR calc non Af Amer: >60 mL/min (ref >60)
Glucose, Bld: 96 mg/dL (ref 65–99)
Potassium: 3.4 mmol/L — ABNORMAL LOW (ref 3.5–5.1)
Sodium: 140 mmol/L (ref 135–145)

## 2014-09-10 MED ORDER — FUROSEMIDE 10 MG/ML IJ SOLN
40.0000 mg | Freq: Once | INTRAMUSCULAR | Status: AC
  Administered 2014-09-10: 40 mg via INTRAVENOUS

## 2014-09-10 MED ORDER — POTASSIUM CHLORIDE CRYS ER 20 MEQ PO TBCR
40.0000 meq | EXTENDED_RELEASE_TABLET | Freq: Two times a day (BID) | ORAL | Status: DC
  Administered 2014-09-10 – 2014-09-11 (×3): 40 meq via ORAL
  Filled 2014-09-10 (×4): qty 2

## 2014-09-10 MED ORDER — FUROSEMIDE 10 MG/ML IJ SOLN
80.0000 mg | Freq: Two times a day (BID) | INTRAMUSCULAR | Status: DC
  Administered 2014-09-10 – 2014-09-12 (×4): 80 mg via INTRAVENOUS
  Filled 2014-09-10 (×5): qty 8

## 2014-09-10 NOTE — Progress Notes (Signed)
Primary Cardiologist (EP): Dr. Caryl Comes   Patient Profile: 81 y.o. male with a history of high-grade AV block s/p medtronic dual chamber pacemaker, HTN, HLD, femoral DVT and BPH who presented with worsening dyspnea.  Subjective: Breathing ok at rest.  No chest pain. Feels like urine has "slowed"  Objective: Vital signs in last 24 hours: Temp:  [97.8 F (36.6 C)-98.2 F (36.8 C)] 98.1 F (36.7 C) (07/01 0502) Pulse Rate:  [58-83] 83 (07/01 0502) Resp:  [18-20] 18 (07/01 0502) BP: (117-138)/(47-56) 138/50 mmHg (07/01 0502) SpO2:  [98 %-100 %] 98 % (07/01 0502) Weight:  [203 lb 11.2 oz (92.398 kg)] 203 lb 11.2 oz (92.398 kg) (07/01 0502) Last BM Date: 09/09/14  Intake/Output from previous day: 06/30 0701 - 07/01 0700 In: 1320 [P.O.:1320] Out: 2200 [Urine:2200] Intake/Output this shift:    Medications Current Facility-Administered Medications  Medication Dose Route Frequency Provider Last Rate Last Dose  . 0.9 %  sodium chloride infusion  250 mL Intravenous PRN Bhavinkumar Bhagat, PA      . acetaminophen (TYLENOL) tablet 650 mg  650 mg Oral Q4H PRN Bhavinkumar Bhagat, PA      . aspirin chewable tablet 81 mg  81 mg Oral Daily Jolaine Artist, MD   81 mg at 09/09/14 0160  . atorvastatin (LIPITOR) tablet 40 mg  40 mg Oral q1800 Bhavinkumar Bhagat, PA   40 mg at 09/09/14 1712  . furosemide (LASIX) injection 40 mg  40 mg Intravenous Once Jerline Pain, MD      . furosemide (LASIX) injection 80 mg  80 mg Intravenous BID Jerline Pain, MD      . heparin injection 5,000 Units  5,000 Units Subcutaneous 3 times per day Leanor Kail, PA   5,000 Units at 09/09/14 2229  . labetalol (NORMODYNE) tablet 100 mg  100 mg Oral BID Bhavinkumar Bhagat, PA   100 mg at 09/09/14 2229  . lisinopril (PRINIVIL,ZESTRIL) tablet 20 mg  20 mg Oral Daily Bhavinkumar Bhagat, PA   20 mg at 09/09/14 0936  . ondansetron (ZOFRAN) injection 4 mg  4 mg Intravenous Q6H PRN Bhavinkumar Bhagat, PA      .  potassium chloride SA (K-DUR,KLOR-CON) CR tablet 40 mEq  40 mEq Oral BID Jerline Pain, MD      . sodium chloride 0.9 % injection 3 mL  3 mL Intravenous Q12H Bhavinkumar Bhagat, PA   3 mL at 09/09/14 2230  . sodium chloride 0.9 % injection 3 mL  3 mL Intravenous PRN Bhavinkumar Bhagat, PA      . timolol (TIMOPTIC) 0.5 % ophthalmic solution 1 drop  1 drop Both Eyes Daily Jolaine Artist, MD   1 drop at 09/09/14 0937   Filed Weights   09/08/14 0633 09/09/14 0812 09/10/14 0502  Weight: 207 lb 0.2 oz (93.9 kg) 207 lb 7.3 oz (94.1 kg) 203 lb 11.2 oz (92.398 kg)    PE: General appearance: alert, cooperative and no distress Neck: no carotid bruit; mildly elevated JVD Lungs: faint bibasilar crackles Heart: regular rate and rhythm Extremities: trace bilateral LEE R>L ABS: protuberant Pulses: 2+ and symmetric Skin: warm and dry Neurologic: Grossly normal  Lab Results:   Recent Labs  09/07/14 1317  WBC 6.0  HGB 12.0*  HCT 35.0*  PLT 126*   BMET  Recent Labs  09/08/14 0344 09/09/14 0408 09/10/14 0423  NA 140 138 140  K 3.4* 3.2* 3.4*  CL 102 101 103  CO2 28 29 29   GLUCOSE  76 91 96  BUN 10 13 17   CREATININE 0.99 1.02 1.01  CALCIUM 8.7* 8.8* 9.1   PT/INR No results for input(s): LABPROT, INR in the last 72 hours. Cholesterol  Recent Labs  09/08/14 0344  CHOL 111    2D Echo: Study Conclusions  - Left ventricle: The cavity size was normal. There was mild focal basal and mild concentric hypertrophy of the septum. Systolic function was mildly reduced. The estimated ejection fraction was in the range of 45% to 50%. Diffuse hypokinesis. Features are consistent with a pseudonormal left ventricular filling pattern, with concomitant abnormal relaxation and increased filling pressure (grade 2 diastolic dysfunction). Doppler parameters are consistent with elevated ventricular end-diastolic filling pressure. - Aortic valve: Trileaflet; mildly thickened,  mildly calcified leaflets. Transvalvular velocity was minimally increased. There was no stenosis. There was no regurgitation. - Mitral valve: Mildly thickened, moderately calcified leaflets . There was moderate regurgitation. - Left atrium: The atrium was normal in size. - Right ventricle: Systolic function was normal. - Right atrium: Pacer wire or catheter noted in right atrium. - Tricuspid valve: There was moderate regurgitation. - Pulmonary arteries: Systolic pressure was moderately increased. PA peak pressure: 51 mm Hg (S).  Impressions:  - LVEF appears midly impaired, the estimate might be affected by poorly visualized endocardium and frequent PVCs. Filling pressures are elevated. Moderate mitral and tricuspid regurgitation. There is moderate pulmonary hypertension.   Assessment/Plan  Principal Problem:   Acute on chronic diastolic congestive heart failure Active Problems:   Hypertension   Heart failure   Hypokalemia   1. Acute on chronic diastolic CHF:  - LEE improved but still with faint bibasilar crackles           - Increase IV Lasix to 80 BID (give extra 40 now)           - I/Os net negative 2.2L           - BP and renal function stable. Will supplement K.            - low sodium diet.   2. Systolic Dysfunction:   - EF now 45-50%, down from 55-60% on previous echo 10/2012.  - consider NST as outpatient to assess for myocardial ischemia  - continue BB and ACE-I therapy  2. HTN: -Well controlled this am .  - continue labetalol, lisinopril and lasix.   3. Hypokalemia:   - K at 3.4 today. - Replace with K-dur 40 mEq PO BID (x 4 doses)  - Recheck BMET in AM.   4. H/o complete heart block s/p dual chamber PPM: - AV delay shortened from 350/388ms to 180/174ms on 06/29/14 per Dr. Caryl Comes.  - Device interrogated on this admit.. pacing and sensing correction, battery  life good. All device parameters normal. Detection was set to record atrial high rates faster than 175bpm and ventricular high rates faster than 180s, both has been reset to 150bpm in both chambers  5. Hx of femoral DVT:  - heparin injections for VTE prophylaxis   Possible home Sunday. HHPT    LOS: 2 days    Candee Furbish, MD

## 2014-09-11 MED ORDER — POTASSIUM CHLORIDE CRYS ER 20 MEQ PO TBCR
60.0000 meq | EXTENDED_RELEASE_TABLET | Freq: Two times a day (BID) | ORAL | Status: DC
  Administered 2014-09-11 – 2014-09-14 (×6): 60 meq via ORAL
  Filled 2014-09-11 (×8): qty 3

## 2014-09-11 MED ORDER — METOLAZONE 2.5 MG PO TABS
2.5000 mg | ORAL_TABLET | Freq: Every day | ORAL | Status: DC
  Administered 2014-09-11 – 2014-09-12 (×2): 2.5 mg via ORAL
  Filled 2014-09-11 (×3): qty 1

## 2014-09-11 NOTE — Progress Notes (Signed)
1729 resting comfortably in bed. Family in attendance

## 2014-09-11 NOTE — Progress Notes (Addendum)
Primary Cardiologist (EP): Dr. Caryl Comes   Patient Profile: 81 y.o. male with a history of high-grade AV block s/p medtronic dual chamber pacemaker, HTN, HLD, femoral DVT and BPH who presented with worsening dyspnea.  Subjective: Breathing ok at rest.  Lasix increased yesterday to 80 mg IV BID. Urine output overnight was only about 300 cc. Total out around 3L. Potassium low today at 3.4. Creatinine is stable. Weight is up 2 Lbs.  Objective: Vital signs in last 24 hours: Temp:  [97.4 F (36.3 C)-98.2 F (36.8 C)] 97.4 F (36.3 C) (07/02 0528) Pulse Rate:  [37-80] 65 (07/02 0528) Resp:  [17-18] 18 (07/02 0528) BP: (140-147)/(58-87) 140/87 mmHg (07/02 0528) SpO2:  [100 %] 100 % (07/02 0528) Weight:  [205 lb 14.6 oz (93.4 kg)] 205 lb 14.6 oz (93.4 kg) (07/02 0528) Last BM Date: 09/10/14  Intake/Output from previous day: 07/01 0701 - 07/02 0700 In: 700 [P.O.:700] Out: 976 [Urine:975; Stool:1] Intake/Output this shift:    Medications Current Facility-Administered Medications  Medication Dose Route Frequency Provider Last Rate Last Dose  . 0.9 %  sodium chloride infusion  250 mL Intravenous PRN Bhavinkumar Bhagat, PA      . acetaminophen (TYLENOL) tablet 650 mg  650 mg Oral Q4H PRN Bhavinkumar Bhagat, PA      . aspirin chewable tablet 81 mg  81 mg Oral Daily Jolaine Artist, MD   81 mg at 09/11/14 1057  . atorvastatin (LIPITOR) tablet 40 mg  40 mg Oral q1800 Bhavinkumar Bhagat, PA   40 mg at 09/10/14 1849  . furosemide (LASIX) injection 80 mg  80 mg Intravenous BID Jerline Pain, MD   80 mg at 09/11/14 0848  . heparin injection 5,000 Units  5,000 Units Subcutaneous 3 times per day Leanor Kail, PA   5,000 Units at 09/11/14 0616  . labetalol (NORMODYNE) tablet 100 mg  100 mg Oral BID Bhavinkumar Bhagat, PA   100 mg at 09/11/14 1053  . lisinopril (PRINIVIL,ZESTRIL) tablet 20 mg  20 mg Oral Daily Bhavinkumar Bhagat, PA   20 mg at 09/11/14 1053  . ondansetron (ZOFRAN) injection 4  mg  4 mg Intravenous Q6H PRN Bhavinkumar Bhagat, PA      . potassium chloride SA (K-DUR,KLOR-CON) CR tablet 40 mEq  40 mEq Oral BID Jerline Pain, MD   40 mEq at 09/11/14 1053  . sodium chloride 0.9 % injection 3 mL  3 mL Intravenous Q12H Bhavinkumar Bhagat, PA   3 mL at 09/11/14 1054  . sodium chloride 0.9 % injection 3 mL  3 mL Intravenous PRN Bhavinkumar Bhagat, PA      . timolol (TIMOPTIC) 0.5 % ophthalmic solution 1 drop  1 drop Both Eyes Daily Jolaine Artist, MD   1 drop at 09/11/14 1054   Filed Weights   09/09/14 0812 09/10/14 0502 09/11/14 0528  Weight: 207 lb 7.3 oz (94.1 kg) 203 lb 11.2 oz (92.398 kg) 205 lb 14.6 oz (93.4 kg)    PE: General appearance: alert, cooperative and no distress Neck: no carotid bruit; mildly elevated JVD Lungs: faint bibasilar crackles Heart: regular rate and rhythm Extremities: trace bilateral LEE R>L ABS: protuberant Pulses: 2+ and symmetric Skin: warm and dry Neurologic: Grossly normal  Lab Results:  No results for input(s): WBC, HGB, HCT, PLT in the last 72 hours. BMET  Recent Labs  09/09/14 0408 09/10/14 0423  NA 138 140  K 3.2* 3.4*  CL 101 103  CO2 29 29  GLUCOSE 91 96  BUN 13 17  CREATININE 1.02 1.01  CALCIUM 8.8* 9.1   PT/INR No results for input(s): LABPROT, INR in the last 72 hours. Cholesterol No results for input(s): CHOL in the last 72 hours.  2D Echo: Study Conclusions  - Left ventricle: The cavity size was normal. There was mild focal basal and mild concentric hypertrophy of the septum. Systolic function was mildly reduced. The estimated ejection fraction was in the range of 45% to 50%. Diffuse hypokinesis. Features are consistent with a pseudonormal left ventricular filling pattern, with concomitant abnormal relaxation and increased filling pressure (grade 2 diastolic dysfunction). Doppler parameters are consistent with elevated ventricular end-diastolic filling pressure. - Aortic valve:  Trileaflet; mildly thickened, mildly calcified leaflets. Transvalvular velocity was minimally increased. There was no stenosis. There was no regurgitation. - Mitral valve: Mildly thickened, moderately calcified leaflets . There was moderate regurgitation. - Left atrium: The atrium was normal in size. - Right ventricle: Systolic function was normal. - Right atrium: Pacer wire or catheter noted in right atrium. - Tricuspid valve: There was moderate regurgitation. - Pulmonary arteries: Systolic pressure was moderately increased. PA peak pressure: 51 mm Hg (S).  Impressions:  - LVEF appears midly impaired, the estimate might be affected by poorly visualized endocardium and frequent PVCs. Filling pressures are elevated. Moderate mitral and tricuspid regurgitation. There is moderate pulmonary hypertension.   Assessment/Plan  Principal Problem:   Acute on chronic diastolic congestive heart failure Active Problems:   Hypertension   Heart failure   Hypokalemia   1. Acute on chronic diastolic CHF:  - LEE improved but still with faint bibasilar crackles, more dyspnea today           - continue IV Lasix to 80 BID  -add metolazone 2.5 mg daily today.           - I/Os net negative 2.2L           - BP and renal function stable. Will supplement K.            - low sodium diet.   2. Systolic Dysfunction:   - EF now 45-50%, down from 55-60% on previous echo 10/2012.  - consider NST as outpatient to assess for myocardial ischemia  - continue BB and ACE-I therapy  2. HTN: -Well controlled this am .  - continue labetalol, lisinopril and lasix.   3. Hypokalemia:   - K at 3.4 today. - Increase K-dur to 60 mEq PO BID  - Recheck BMET in AM.   4. H/o complete heart block s/p dual chamber PPM: - AV delay shortened from 350/345ms to 180/168ms on 06/29/14 per Dr. Caryl Comes.  - Device interrogated  on this admit.. pacing and sensing correction, battery life good. All device parameters normal. Detection was set to record atrial high rates faster than 175bpm and ventricular high rates faster than 180s, both has been reset to 150bpm in both chambers  5. Hx of femoral DVT:  - heparin injections for VTE prophylaxis   Possible home Sunday. HHPT   LOS: 3 days   Pixie Casino, MD, [facility] Attending Cardiologist [facility]  Pixie Casino, MD

## 2014-09-12 MED ORDER — METOLAZONE 2.5 MG PO TABS
2.5000 mg | ORAL_TABLET | ORAL | Status: DC
  Administered 2014-09-13: 2.5 mg via ORAL
  Filled 2014-09-12 (×2): qty 1

## 2014-09-12 MED ORDER — FUROSEMIDE 80 MG PO TABS
80.0000 mg | ORAL_TABLET | Freq: Two times a day (BID) | ORAL | Status: DC
  Administered 2014-09-12 – 2014-09-13 (×2): 80 mg via ORAL
  Filled 2014-09-12 (×4): qty 1

## 2014-09-12 NOTE — Progress Notes (Signed)
Primary Cardiologist (EP): Dr. Caryl Comes   Patient Profile: 81 y.o. male with a history of high-grade AV block s/p medtronic dual chamber pacemaker, HTN, HLD, femoral DVT and BPH who presented with worsening dyspnea.  Subjective: Excellent response to metolazone yesterday.  Lasix increased yesterday to 80 mg IV BID. Urine output -2.8L. Total out around -6L. Potassium low today at 3.4. Creatinine is stable. Weight is up 2 Lbs.  Objective: Vital signs in last 24 hours: Temp:  [97.6 F (36.4 C)-97.9 F (36.6 C)] 97.9 F (36.6 C) (07/03 0512) Pulse Rate:  [41-98] 72 (07/03 1054) Resp:  [18-19] 18 (07/03 1054) BP: (115-140)/(45-77) 115/45 mmHg (07/03 1054) SpO2:  [100 %] 100 % (07/03 1054) Weight:  [207 lb 0.2 oz (93.9 kg)] 207 lb 0.2 oz (93.9 kg) (07/03 0512) Last BM Date: 09/11/14  Intake/Output from previous day: 07/02 0701 - 07/03 0700 In: 27 [P.O.:560] Out: 3401 [Urine:3400; Stool:1] Intake/Output this shift:    Medications Current Facility-Administered Medications  Medication Dose Route Frequency Provider Last Rate Last Dose  . 0.9 %  sodium chloride infusion  250 mL Intravenous PRN Bhavinkumar Bhagat, PA      . acetaminophen (TYLENOL) tablet 650 mg  650 mg Oral Q4H PRN Bhavinkumar Bhagat, PA      . aspirin chewable tablet 81 mg  81 mg Oral Daily Jolaine Artist, MD   81 mg at 09/12/14 1057  . atorvastatin (LIPITOR) tablet 40 mg  40 mg Oral q1800 Bhavinkumar Bhagat, PA   40 mg at 09/11/14 1728  . furosemide (LASIX) injection 80 mg  80 mg Intravenous BID Jerline Pain, MD   80 mg at 09/12/14 0800  . heparin injection 5,000 Units  5,000 Units Subcutaneous 3 times per day Leanor Kail, PA   5,000 Units at 09/12/14 0615  . labetalol (NORMODYNE) tablet 100 mg  100 mg Oral BID Bhavinkumar Bhagat, PA   100 mg at 09/12/14 1055  . lisinopril (PRINIVIL,ZESTRIL) tablet 20 mg  20 mg Oral Daily Bhavinkumar Bhagat, PA   20 mg at 09/12/14 1055  . metolazone (ZAROXOLYN) tablet 2.5 mg   2.5 mg Oral Daily Pixie Casino, MD   2.5 mg at 09/12/14 8502  . ondansetron (ZOFRAN) injection 4 mg  4 mg Intravenous Q6H PRN Bhavinkumar Bhagat, PA      . potassium chloride SA (K-DUR,KLOR-CON) CR tablet 60 mEq  60 mEq Oral BID Pixie Casino, MD   60 mEq at 09/12/14 1055  . sodium chloride 0.9 % injection 3 mL  3 mL Intravenous Q12H Bhavinkumar Bhagat, PA   3 mL at 09/12/14 1055  . sodium chloride 0.9 % injection 3 mL  3 mL Intravenous PRN Bhavinkumar Bhagat, PA      . timolol (TIMOPTIC) 0.5 % ophthalmic solution 1 drop  1 drop Both Eyes Daily Jolaine Artist, MD   1 drop at 09/12/14 1055   Filed Weights   09/10/14 0502 09/11/14 0528 09/12/14 0512  Weight: 203 lb 11.2 oz (92.398 kg) 205 lb 14.6 oz (93.4 kg) 207 lb 0.2 oz (93.9 kg)    PE: General appearance: alert, cooperative and no distress Neck: no carotid bruit; mildly elevated JVD Lungs: faint bibasilar crackles Heart: regular rate and rhythm Extremities: trace bilateral LEE R>L ABS: protuberant Pulses: 2+ and symmetric Skin: warm and dry Neurologic: Grossly normal  Lab Results:  No results for input(s): WBC, HGB, HCT, PLT in the last 72 hours. BMET  Recent Labs  09/10/14 0423  NA 140  K 3.4*  CL 103  CO2 29  GLUCOSE 96  BUN 17  CREATININE 1.01  CALCIUM 9.1   PT/INR No results for input(s): LABPROT, INR in the last 72 hours. Cholesterol No results for input(s): CHOL in the last 72 hours.  2D Echo: Study Conclusions  - Left ventricle: The cavity size was normal. There was mild focal basal and mild concentric hypertrophy of the septum. Systolic function was mildly reduced. The estimated ejection fraction was in the range of 45% to 50%. Diffuse hypokinesis. Features are consistent with a pseudonormal left ventricular filling pattern, with concomitant abnormal relaxation and increased filling pressure (grade 2 diastolic dysfunction). Doppler parameters are consistent with elevated  ventricular end-diastolic filling pressure. - Aortic valve: Trileaflet; mildly thickened, mildly calcified leaflets. Transvalvular velocity was minimally increased. There was no stenosis. There was no regurgitation. - Mitral valve: Mildly thickened, moderately calcified leaflets . There was moderate regurgitation. - Left atrium: The atrium was normal in size. - Right ventricle: Systolic function was normal. - Right atrium: Pacer wire or catheter noted in right atrium. - Tricuspid valve: There was moderate regurgitation. - Pulmonary arteries: Systolic pressure was moderately increased. PA peak pressure: 51 mm Hg (S).  Impressions:  - LVEF appears midly impaired, the estimate might be affected by poorly visualized endocardium and frequent PVCs. Filling pressures are elevated. Moderate mitral and tricuspid regurgitation. There is moderate pulmonary hypertension.   Assessment/Plan  Principal Problem:   Acute on chronic diastolic congestive heart failure Active Problems:   Hypertension   Heart failure   Hypokalemia   1. Acute on chronic diastolic CHF:  - LEE improved but still with faint bibasilar crackles, more dyspnea today           - switch to lasix 80 mg po BID today  -change metolazone 2.5 mg - 3x weekly           - I/Os net negative 2.2L           - BP and renal function stable. Will supplement K.            - low sodium diet.   2. Systolic Dysfunction:   - EF now 45-50%, down from 55-60% on previous echo 10/2012.  - consider NST as outpatient to assess for myocardial ischemia  - continue BB and ACE-I therapy  2. HTN: -Diastolic low today - hold lisinopril today, okay to give labetalol .  - continue labetalol, lisinopril and lasix.   3. Hypokalemia:   - K at 3.4 today. - Continue K-dur to 60 mEq PO BID  - Recheck BMET in AM.   4. H/o complete heart block s/p dual chamber  PPM: - AV delay shortened from 350/330ms to 180/130ms on 06/29/14 per Dr. Caryl Comes.  - Device interrogated on this admit.. pacing and sensing correction, battery life good. All device parameters normal. Detection was set to record atrial high rates faster than 175bpm and ventricular high rates faster than 180s, both has been reset to 150bpm in both chambers  5. Hx of femoral DVT:  - heparin injections for VTE prophylaxis   Possible discharge tomorrow. HHPT FIT with study vest for CHF prior to discharge.   LOS: 4 days   Pixie Casino, MD, [facility] Attending Cardiologist [facility]  Pixie Casino, MD

## 2014-09-13 LAB — BASIC METABOLIC PANEL
Anion gap: 8 (ref 5–15)
Anion gap: 9 (ref 5–15)
BUN: 18 mg/dL (ref 6–20)
BUN: 18 mg/dL (ref 6–20)
CALCIUM: 9 mg/dL (ref 8.9–10.3)
CHLORIDE: 97 mmol/L — AB (ref 101–111)
CO2: 31 mmol/L (ref 22–32)
CO2: 30 mmol/L (ref 22–32)
CREATININE: 1.14 mg/dL (ref 0.61–1.24)
CREATININE: 1.09 mg/dL (ref 0.61–1.24)
Calcium: 9 mg/dL (ref 8.9–10.3)
Chloride: 97 mmol/L — ABNORMAL LOW (ref 101–111)
GFR calc Af Amer: >60 mL/min (ref >60)
GFR calc non Af Amer: 59 mL/min — ABNORMAL LOW (ref >60)
GFR, EST NON AFRICAN AMERICAN: 56 mL/min — AB (ref >60)
GLUCOSE: 109 mg/dL — AB (ref 65–99)
Glucose, Bld: 102 mg/dL — ABNORMAL HIGH (ref 65–99)
POTASSIUM: 4.2 mmol/L (ref 3.5–5.1)
Potassium: 5.1 mmol/L (ref 3.5–5.1)
SODIUM: 136 mmol/L (ref 135–145)
Sodium: 136 mmol/L (ref 135–145)

## 2014-09-13 LAB — MAGNESIUM: Magnesium: 1.9 mg/dL (ref 1.7–2.4)

## 2014-09-13 MED ORDER — FUROSEMIDE 10 MG/ML IJ SOLN
80.0000 mg | Freq: Two times a day (BID) | INTRAMUSCULAR | Status: AC
  Administered 2014-09-13 – 2014-09-14 (×2): 80 mg via INTRAVENOUS
  Filled 2014-09-13: qty 8

## 2014-09-13 NOTE — Progress Notes (Signed)
Patient Name: [name]      SUBJECTIVE  Admitted with A/C HFpEF/HFrEF  With now 9 L diuresis Has CHB with prev implanted PM>> P-synchronous/ AV  Pacing  Improved but stioll some SOB  Has not been up    History reviewed, not clear as to whether his deterioration has been gradual or more abrupt.  So not sure how pacing program( see below) might have contributed  Hypokalemia has abeen an issue  At office visit 4/16 we reducedd AV delay given programmed AV delay 350  Past Medical History  Diagnosis Date  . Atrioventricular block, complete     intermittent  . Pacemaker     medtronic dual chamber  . Hepatotoxicity   . Lipoma     pt unaware of this on 09/08/2014  . DVT femoral (deep venous thrombosis) with thrombophlebitis     LUE  . Hyperlipidemia   . Hx of colonic polyps   . Erectile dysfunction   . BPH (benign prostatic hypertrophy)   . HTN (hypertension)   . Chest pain     a. 09/2012 [facility] Cardiolite: EF 57%, diaphragmatic atten of inf wall w/o ischemia.  . Pneumonia 1929  . Arthritis     "it's beginning to come on now" (09/08/2014)    Scheduled Meds:  Scheduled Meds: . aspirin  81 mg Oral Daily  . atorvastatin  40 mg Oral q1800  . furosemide  80 mg Oral BID  . heparin  5,000 Units Subcutaneous 3 times per day  . labetalol  100 mg Oral BID  . lisinopril  20 mg Oral Daily  . metolazone  2.5 mg Oral Once per day on Mon Wed Fri  . potassium chloride  60 mEq Oral BID  . sodium chloride  3 mL Intravenous Q12H  . timolol  1 drop Both Eyes Daily   Continuous Infusions:  sodium chloride, acetaminophen, ondansetron (ZOFRAN) IV, sodium chloride    PHYSICAL EXAM Filed Vitals:   09/12/14 1430 09/12/14 2030 09/13/14 0457 09/13/14 0935  BP: 130/70 125/59 143/64 137/57  Pulse: 89 90 59 68  Temp: 97.3 F (36.3 C) 98.1 F (36.7 C) 98 F (36.7 C)   TempSrc: Oral Oral Oral   Resp: 18 18 19    Height:      Weight:   195 lb 9.6 oz (88.724 kg)   SpO2:  100% 98% 100% 100%   Well developed and nourished in no acute distress HENT normal Neck supple with JVP-9-10 Clear Regular rate and rhythm 2/6 murmur Abd-soft with active BS No Clubbing cyanosis 1-2+ edema Skin-warm and dry A & Oriented  Grossly normal sensory and motor function   TELEMETRY: Reviewed telemetry pt in nsr/. P-synchronous/ AV  pacing *:    Intake/Output Summary (Last 24 hours) at 09/13/14 1031 Last data filed at 09/13/14 0935  Gross per 24 hour  Intake    840 ml  Output   3925 ml  Net  -3085 ml   Out 9 L since admission      LABS: Basic Metabolic Panel:  Recent Labs Lab 09/07/14 1317 09/08/14 0344 09/09/14 0408 09/10/14 0423 09/13/14 0910  NA 138 140 138 140 136  K 3.6 3.4* 3.2* 3.4* 5.1  CL 108 102 101 103 97*  CO2 23 28 29 29 31   GLUCOSE 101* 76 91 96 109*  BUN 8 10 13 17 18   CREATININE 0.86 0.99 1.02 1.01 1.14  CALCIUM 8.5* 8.7* 8.8* 9.1 9.0  MG  --   --   --   --  1.9   Cardiac Enzymes: No results for input(s): CKTOTAL, CKMB, CKMBINDEX, TROPONINI in the last 72 hours. CBC:  Recent Labs Lab 09/07/14 1317  WBC 6.0  HGB 12.0*  HCT 35.0*  MCV 86.6  PLT 126*   PROTIME: No results for input(s): LABPROT, INR in the last 72 hours. Liver Function Tests: No results for input(s): AST, ALT, ALKPHOS, BILITOT, PROT, ALBUMIN in the last 72 hours. No results for input(s): LIPASE, AMYLASE in the last 72 hours. BNP: BNP (last 3 results)  Recent Labs  09/07/14 1317  BNP 601.5*    ProBNP (last 3 results) No results for input(s): PROBNP in the last 8760 hours.     ASSESSMENT AND PLAN:  Principal Problem:   Acute on chronic diastolic congestive heart failure Active Problems:   Hypertension   Heart failure   Hypokalemia  Will check mag  Specimen hemolyzed so will repeat  Still with significant volume and so will continue IV diuresis until BUN/Cr bumps  OT and PT to see  Did my shortening of AV delay contribute to CHF  ??  Will follow  Signed, Virl Axe MD  09/13/2014

## 2014-09-13 NOTE — Progress Notes (Signed)
PT Cancellation Note  Patient Details Name: [name] MRN: [id number] DOB: 28-Mar-1925   Cancelled Treatment:    Reason Eval/Treat Not Completed: Fatigue/lethargy limiting ability to participate; attempted twice, patient reports just got back after trip to bathroom and wishes to wait to walk once his wife comes.  Encouraged to ask nursing staff for assist when she comes for safety.  Will follow up tomorrow.   WYNN,CYNDI 09/13/2014, 4:12 PM  Magda Kiel, [place] 09/13/2014

## 2014-09-13 NOTE — Care Management (Signed)
Important Message  Patient Details  Name: [name] MRN: [id number] Date of Birth: 1925-12-12   Medicare Important Message Given:  Yes-second notification given    Loann Quill 09/13/2014, 9:39 AM

## 2014-09-14 LAB — BASIC METABOLIC PANEL
Anion gap: 7 (ref 5–15)
BUN: 20 mg/dL (ref 6–20)
CALCIUM: 9.4 mg/dL (ref 8.9–10.3)
CO2: 34 mmol/L — ABNORMAL HIGH (ref 22–32)
CREATININE: 1.27 mg/dL — AB (ref 0.61–1.24)
Chloride: 95 mmol/L — ABNORMAL LOW (ref 101–111)
GFR calc Af Amer: 57 mL/min — ABNORMAL LOW (ref >60)
GFR calc non Af Amer: 49 mL/min — ABNORMAL LOW (ref >60)
Glucose, Bld: 95 mg/dL (ref 65–99)
Potassium: 5.1 mmol/L (ref 3.5–5.1)
SODIUM: 136 mmol/L (ref 135–145)

## 2014-09-14 MED ORDER — FUROSEMIDE 40 MG PO TABS
60.0000 mg | ORAL_TABLET | Freq: Every day | ORAL | Status: DC
  Administered 2014-09-15: 60 mg via ORAL
  Filled 2014-09-14: qty 1

## 2014-09-14 MED ORDER — POTASSIUM CHLORIDE CRYS ER 20 MEQ PO TBCR
60.0000 meq | EXTENDED_RELEASE_TABLET | Freq: Every day | ORAL | Status: DC
  Administered 2014-09-15: 60 meq via ORAL
  Filled 2014-09-14: qty 3

## 2014-09-14 NOTE — Progress Notes (Signed)
UR completed 

## 2014-09-14 NOTE — Progress Notes (Signed)
Patient Name: [name] Date of Encounter: 09/14/2014  Primary Cardiologist (EP): Dr. Caryl Comes    Patient Profile: 81 y.o. male with a history of high-grade AV block s/p medtronic dual chamber pacemaker, HTN, HLD, femoral DVT and BPH who presented with worsening dyspnea.  Principal Problem:   Acute on chronic diastolic congestive heart failure Active Problems:   Hypertension   Heart failure   Hypokalemia    SUBJECTIVE  Denies any CP or SOB  CURRENT MEDS . aspirin  81 mg Oral Daily  . atorvastatin  40 mg Oral q1800  . heparin  5,000 Units Subcutaneous 3 times per day  . labetalol  100 mg Oral BID  . lisinopril  20 mg Oral Daily  . metolazone  2.5 mg Oral Once per day on Mon Wed Fri  . potassium chloride  60 mEq Oral BID  . sodium chloride  3 mL Intravenous Q12H  . timolol  1 drop Both Eyes Daily    OBJECTIVE  Filed Vitals:   09/13/14 1403 09/13/14 2022 09/14/14 0731 09/14/14 0743  BP: 110/48 119/56 130/56   Pulse: 66 71 71   Temp: 98.1 F (36.7 C) 97.8 F (36.6 C) 98.6 F (37 C)   TempSrc: Oral Oral Oral   Resp: 20 18 18    Height:      Weight:    194 lb 12.8 oz (88.361 kg)  SpO2: 100% 98% 95%     Intake/Output Summary (Last 24 hours) at 09/14/14 0909 Last data filed at 09/14/14 0826  Gross per 24 hour  Intake   1200 ml  Output   4325 ml  Net  -3125 ml   Filed Weights   09/12/14 0512 09/13/14 0457 09/14/14 0743  Weight: 207 lb 0.2 oz (93.9 kg) 195 lb 9.6 oz (88.724 kg) 194 lb 12.8 oz (88.361 kg)    PHYSICAL EXAM  General: Pleasant, NAD. Neuro: Alert and oriented X 3. Moves all extremities spontaneously. Psych: Normal affect. HEENT:  Normal  Neck: Supple without bruits or JVD. Lungs:  Resp regular and unlabored, CTA. Heart: RRR no s3, s4, or murmurs. Abdomen: Soft, non-tender, non-distended, BS + x 4.  Extremities: No clubbing, cyanosis. DP/PT/Radials 2+ and equal bilaterally. 1+ pitting edema  Accessory Clinical Findings  Basic Metabolic  Panel  Recent Labs  09/13/14 0910 09/13/14 1150 09/14/14 0509  NA 136 136 136  K 5.1 4.2 5.1  CL 97* 97* 95*  CO2 31 30 34*  GLUCOSE 109* 102* 95  BUN 18 18 20   CREATININE 1.14 1.09 1.27*  CALCIUM 9.0 9.0 9.4  MG 1.9  --   --     TELE NSR overnight, off tele for now while bathing    ECG  No new EKG  Echocardiogram 09/08/2014  LV EF: 45% -  50%  ------------------------------------------------------------------- Indications:   CHF - 428.0.  ------------------------------------------------------------------- History:  PMH:  Atrial fibrillation. Risk factors: Hypertension. Dyslipidemia.  ------------------------------------------------------------------- Study Conclusions  - Left ventricle: The cavity size was normal. There was mild focal basal and mild concentric hypertrophy of the septum. Systolic function was mildly reduced. The estimated ejection fraction was in the range of 45% to 50%. Diffuse hypokinesis. Features are consistent with a pseudonormal left ventricular filling pattern, with concomitant abnormal relaxation and increased filling pressure (grade 2 diastolic dysfunction). Doppler parameters are consistent with elevated ventricular end-diastolic filling pressure. - Aortic valve: Trileaflet; mildly thickened, mildly calcified leaflets. Transvalvular velocity was minimally increased. There was no stenosis. There was no regurgitation. -  Mitral valve: Mildly thickened, moderately calcified leaflets . There was moderate regurgitation. - Left atrium: The atrium was normal in size. - Right ventricle: Systolic function was normal. - Right atrium: Pacer wire or catheter noted in right atrium. - Tricuspid valve: There was moderate regurgitation. - Pulmonary arteries: Systolic pressure was moderately increased. PA peak pressure: 51 mm Hg (S).  Impressions:  - LVEF appears midly impaired, the estimate might be affected  by poorly visualized endocardium and frequent PVCs. Filling pressures are elevated. Moderate mitral and tricuspid regurgitation. There is moderate pulmonary hypertension.    Radiology/Studies  Dg Chest 2 View  09/07/2014   CLINICAL DATA:  Shortness of breath.  EXAM: CHEST  2 VIEW  COMPARISON:  September 29, 2012.  FINDINGS: The heart size and mediastinal contours are within normal limits. No pneumothorax is noted. Minimal bilateral pleural effusions are noted Left-sided pacemaker is unchanged in position. Stable elevation of right hemidiaphragm is noted. Increased central vascular congestion is noted as well as perihilar and basilar interstitial densities concerning for possible edema. The visualized skeletal structures are unremarkable.  IMPRESSION: Increased central pulmonary vascular congestion with associated perihilar and basilar interstitial densities concerning for edema. Minimal bilateral pleural effusions are noted.   Electronically Signed   By: Marijo Conception, M.D.   On: 09/07/2014 13:21    ASSESSMENT AND PLAN  1. Acute on chronic diastolic HF  - negative 71G I/O, weight down from 207 lbs to 194 lbs.  - surprising significant response to metolazone, had 4L output on 80mg  PO lasix yesterday with 2.5mg  metolazone. Still has 1+ pitting edema in the leg, however Cr increasing, likely venous stasis  - Will d/c metolazone for now. Will start 60mg  daily of PO lasix tomorrow with PRN metolazone once a week for fluid overload. Likely can be discharged today or tomorrow.  2. New systolic  dysfunction  - EF now 45-50%, down from 55-60% on previous echo 10/2012. - consider NST as outpatient to assess for myocardial ischemia  3. HTN  4. Hypokalemia  5. H/o femoral DVT  Signed, Almyra Deforest PA-C Pager: [phone] As above, patient seen and examined. His dyspnea and edema are improving. We will continue with present dose of diabetics today. Recheck renal function tomorrow  morning. If he is stable plan discharge tomorrow morning and follow-up with Dr. Caryl Comes. Kirk Ruths

## 2014-09-14 NOTE — Progress Notes (Signed)
Physical Therapy Treatment Patient Details Name: [name] MRN: [id number] DOB: May 27, 1925 Today's Date: 09/19/2014    History of Present Illness 81 y.o. male with hx of high-grade AV block s/p medtronic dual chamber pacemaker, HTN, HLD, femoral DVT and BPH who presents with worsening dyspnea and CHF.     PT Comments    Pt with very pleasant demeanor states no birthday plans for next week. Pt with increased activity tolerance, mobility and gait. Pt educated for walking program at D/C, encouraged daily ambulation with nursing assist and continued HEp to maintain strength and function. Will continue to follow.   Follow Up Recommendations  Home health PT;Supervision - Intermittent     Equipment Recommendations  Rolling walker with 5" wheels    Recommendations for Other Services       Precautions / Restrictions Precautions Precautions: Fall    Mobility  Bed Mobility               General bed mobility comments: in chair on arrival and end of session  Transfers Overall transfer level: Modified independent                  Ambulation/Gait Ambulation/Gait assistance: Supervision Ambulation Distance (Feet): 500 Feet Assistive device: Rolling walker (2 wheeled) Gait Pattern/deviations: Step-through pattern;Decreased stride length   Gait velocity interpretation: Below normal speed for age/gender General Gait Details: cues for posture in RW only   Stairs            Wheelchair Mobility    Modified Rankin (Stroke Patients Only)       Balance Overall balance assessment: Needs assistance   Sitting balance-Leahy Scale: Good       Standing balance-Leahy Scale: Fair                      Cognition Arousal/Alertness: Awake/alert Behavior During Therapy: WFL for tasks assessed/performed Overall Cognitive Status: Within Functional Limits for tasks assessed                      Exercises General Exercises - Lower Extremity Long  Arc Quad: AROM;Seated;Both;20 reps Hip Flexion/Marching: AROM;Seated;Both;20 reps    General Comments        Pertinent Vitals/Pain Pain Assessment: No/denies pain  HR 84     Home Living                      Prior Function            PT Goals (current goals can now be found in the care plan section) Progress towards PT goals: Progressing toward goals    Frequency       PT Plan Current plan remains appropriate    Co-evaluation             End of Session   Activity Tolerance: Patient tolerated treatment well Patient left: in chair;with call bell/phone within [name];with chair alarm set     Time: 2951-8841 PT Time Calculation (min) (ACUTE ONLY): 27 min  Charges:  $Gait Training: 8-22 mins $Therapeutic Exercise: 8-22 mins                    G Codes:      [name] 09-19-2014, 1:07 PM [name], [name]

## 2014-09-14 NOTE — Progress Notes (Signed)
I supervised and attest that [facility] Nurse administered all medications correctly.

## 2014-09-14 NOTE — Progress Notes (Signed)
Occupational Therapy Treatment Patient Details Name: [name] MRN: [id number] DOB: Jul 07, 1925 Today's Date: 09/14/2014    History of present illness 81 y.o. male with hx of high-grade AV block s/p medtronic dual chamber pacemaker, HTN, HLD, femoral DVT and BPH who presents with worsening dyspnea and CHF.    OT comments  Pt making progress with functional goals and should continue with acute OT services to increase level of function an safety  Follow Up Recommendations  No OT follow up;Supervision - Intermittent    Equipment Recommendations  Other (comment) (ADL A/E kit, tub bench)    Recommendations for Other Services      Precautions / Restrictions Precautions Precautions: Fall Restrictions Weight Bearing Restrictions: No       Mobility Bed Mobility Overal bed mobility: Needs Assistance Bed Mobility: Supine to Sit     Supine to sit: Supervision     General bed mobility comments: used rails  Transfers Overall transfer level: Modified independent Equipment used: Rolling walker (2 wheeled) Transfers: Sit to/from Stand Sit to Stand: Modified independent (Device/Increase time)         General transfer comment: cues for hand placement    Balance Overall balance assessment: Needs assistance   Sitting balance-Leahy Scale: Good     Standing balance support: During functional activity Standing balance-Leahy Scale: Fair                     ADL Overall ADL's : Needs assistance/impaired     Grooming: Standing;Supervision/safety;Set up       Lower Body Bathing: Minimal assistance;Sitting/lateral leans;Sit to/from stand       Lower Body Dressing: Sit to/from stand;Minimal assistance   Toilet Transfer: Ambulation;RW;Modified Independent;Cueing for sequencing Toilet Transfer Details (indicate cue type and reason): cues for hand placement Toileting- Clothing Manipulation and Hygiene: Sit to/from stand;Supervision/safety     Tub/Shower Transfer  Details (indicate cue type and reason): pt states that he does not plan to get a tub transfer beunch at this time Functional mobility during ADLs: Rolling walker;Supervision/safety;Cueing for safety        Vision  wears glasses, no change from baseline                              Cognition   Behavior During Therapy: [facility] for tasks assessed/performed Overall Cognitive Status: Within Functional Limits for tasks assessed                       Extremity/Trunk Assessment   WFL                       General Comments  pt very pleasant and cooperative    Pertinent Vitals/ Pain       Pain Assessment: No/denies pain                                            Prior Functioning/Environment  independent            Frequency Min 2X/week     Progress Toward Goals  OT Goals(current goals can now be found in the care plan section)  Progress towards OT goals: Progressing toward goals  Acute Rehab OT Goals Patient Stated Goal: go home  Plan Discharge plan remains appropriate  End of Session Equipment Utilized During Treatment: Gait belt;Rolling walker   Activity Tolerance Patient tolerated treatment well   Patient Left with call bell/phone within reach;in chair;with chair alarm set             Time: [id number] OT Time Calculation (min): 25 min  Charges: OT General Charges $OT Visit: 1 Procedure OT Treatments $Self Care/Home Management : 8-22 mins $Therapeutic Activity: 8-22 mins  Britt Bottom 09/14/2014, 1:46 PM

## 2014-09-15 ENCOUNTER — Other Ambulatory Visit: Payer: Self-pay | Admitting: Physician Assistant

## 2014-09-15 DIAGNOSIS — I5033 Acute on chronic diastolic (congestive) heart failure: Secondary | ICD-10-CM

## 2014-09-15 LAB — BASIC METABOLIC PANEL
ANION GAP: 9 (ref 5–15)
BUN: 25 mg/dL — AB (ref 6–20)
CHLORIDE: 98 mmol/L — AB (ref 101–111)
CO2: 30 mmol/L (ref 22–32)
CREATININE: 1.22 mg/dL (ref 0.61–1.24)
Calcium: 9.1 mg/dL (ref 8.9–10.3)
GFR calc Af Amer: >60 mL/min (ref >60)
GFR calc non Af Amer: 52 mL/min — ABNORMAL LOW (ref >60)
GLUCOSE: 83 mg/dL (ref 65–99)
Potassium: 4.2 mmol/L (ref 3.5–5.1)
Sodium: 137 mmol/L (ref 135–145)

## 2014-09-15 LAB — URINALYSIS, ROUTINE W REFLEX MICROSCOPIC
Bilirubin Urine: NEGATIVE
Glucose, UA: NEGATIVE mg/dL
Hgb urine dipstick: NEGATIVE
KETONES UR: NEGATIVE mg/dL
Leukocytes, UA: NEGATIVE
Nitrite: NEGATIVE
PROTEIN: NEGATIVE mg/dL
Specific Gravity, Urine: 1.018 (ref 1.005–1.030)
UROBILINOGEN UA: 1 mg/dL (ref 0.0–1.0)
pH: 8.5 — ABNORMAL HIGH (ref 5.0–8.0)

## 2014-09-15 MED ORDER — POTASSIUM CHLORIDE CRYS ER 20 MEQ PO TBCR: 60.0000 meq | EXTENDED_RELEASE_TABLET | Freq: Every day | ORAL | Status: AC

## 2014-09-15 MED ORDER — LISINOPRIL 20 MG PO TABS: 20.0000 mg | ORAL_TABLET | Freq: Every day | ORAL | Status: AC

## 2014-09-15 MED ORDER — FUROSEMIDE 20 MG PO TABS: 60.0000 mg | ORAL_TABLET | Freq: Every day | ORAL | Status: AC

## 2014-09-15 MED ORDER — METOLAZONE 2.5 MG PO TABS
2.5000 mg | ORAL_TABLET | ORAL | Status: DC
  Filled 2014-09-15: qty 1

## 2014-09-15 NOTE — Progress Notes (Signed)
Patient for possible discharge home today, rolling walker ordered from [facility] to be delivered to room today prior to discharging home; Aneta Mins [phone]

## 2014-09-15 NOTE — Progress Notes (Signed)
ReDS Vest Discharge Study  Results of ReDS reading  Your patient is in the Blinded arm of the Vest at Discharge study.  Your patient has had a ReDS Vest reading and the reading has been transmitted to the cloud.  Your patient is ok for discharge.    Thank You   The research team    

## 2014-09-15 NOTE — Progress Notes (Signed)
Orders received for pt discharge.  Discharge summary printed and reviewed with pt.  Explained medication regimen, and pt had no further questions at this time.  IV removed and site remains clean, dry, intact.  Telemetry removed.  Pt in stable condition and awaiting transport. 

## 2014-09-15 NOTE — Discharge Summary (Signed)
Discharge Summary   Patient ID: [name],  MRN: [id number], DOB/AGE: [birth year] 81 y.o.  Admit date: 09/07/2014 Discharge date: 09/15/2014  Primary Care Provider: Cathlean Cower Primary Cardiologist: Dr. Caryl Comes  Discharge Diagnoses Principal Problem:   Acute on chronic diastolic congestive heart failure Active Problems:   Hypertension   Heart failure   Hypokalemia   Allergies No Known Allergies  Procedures  Echocardiogram 09/08/2014 LV EF: 45% -  50%  ------------------------------------------------------------------- Indications:   CHF - 428.0.  ------------------------------------------------------------------- History:  PMH:  Atrial fibrillation. Risk factors: Hypertension. Dyslipidemia.  ------------------------------------------------------------------- Study Conclusions  - Left ventricle: The cavity size was normal. There was mild focal basal and mild concentric hypertrophy of the septum. Systolic function was mildly reduced. The estimated ejection fraction was in the range of 45% to 50%. Diffuse hypokinesis. Features are consistent with a pseudonormal left ventricular filling pattern, with concomitant abnormal relaxation and increased filling pressure (grade 2 diastolic dysfunction). Doppler parameters are consistent with elevated ventricular end-diastolic filling pressure. - Aortic valve: Trileaflet; mildly thickened, mildly calcified leaflets. Transvalvular velocity was minimally increased. There was no stenosis. There was no regurgitation. - Mitral valve: Mildly thickened, moderately calcified leaflets . There was moderate regurgitation. - Left atrium: The atrium was normal in size. - Right ventricle: Systolic function was normal. - Right atrium: Pacer wire or catheter noted in right atrium. - Tricuspid valve: There was moderate regurgitation. - Pulmonary arteries: Systolic pressure was moderately increased. PA peak  pressure: 51 mm Hg (S).  Impressions:  - LVEF appears midly impaired, the estimate might be affected by poorly visualized endocardium and frequent PVCs. Filling pressures are elevated. Moderate mitral and tricuspid regurgitation. There is moderate pulmonary hypertension.     Hospital Course  [name] is a 81 year old AA male with PMH of complete heart block s/p Medtronic dual-chamber pacemaker, HTN, HLD, femoral DVT and BPH who presented with worsening dyspnea. He was last seen by Dr. Caryl Comes in April 2016 at which time he was complaining of significant peripheral edema and dyspnea on exertion. His amlodipine was discontinued and labetalol 100 mg BID was added. He has been advised to decrease his salt intake and shorten his AV delay to 200/180. He has been compliant with medication and diet however his exertional dyspnea worsened since. He called his cardiologist office on 08/30/2014 and was advised to seek medical attention at the ED. While in the ED, EKG demonstrated V paced rhythm with heart rate of 97, BNP elevated at 601, point of care troponin normal, chest x-ray showed vascular congestion with bilateral pleural effusion. He was given 60 mg IV Lasix in the ED and his breathing improved afterward. He was admitted to cardiology service to start on IV Lasix. It was felt that he had at least 15-20 pounds overweight on board.  Echocardiogram was obtained on 09/08/2014 which showed EF 45-50%, diffuse hypokinesis, grade 2 diastolic dysfunction, moderate TR, moderate MR, PA peak pressure 51. Of note, during this admission, patient agreed to participate in REDS@Discharge  trial and FIT with study vest for CHF prior to discharge. His IV Lasix was later increased to 80 mg BID for more aggressive diuresis. Metolazone was added to his medical regimen. He surprisingly had significant amount of urinary output after metolazone, urinating greater than 4 L per day.   He was later this transitioned to  oral Lasix on 09/14/2014. On physical exam on 7/6, he appears to be euvolemic, with only 1+ pitting edema in the lower extremity is likely chronic in nature. His  creatinine trended up slightly with aggressive diuresis however appears to be stable on repeat lab. He is deemed stable for discharge from cardiology perspective to continue on 60 mg daily of PO Lasix. He has been instructed to take an additional 40 mg of by mouth Lasix if his weight increased by more than 2 pounds per day. Emphasis has been placed on sodium and fluid restriction. I will arrange transition of care follow-up in one week along with BMET and to see Dr. Caryl Comes in 4-6 weeks. Per Dr. Stanford Breed, may consider outpatient functional study following discharge given mildly decreased EF on repeat echo.    Discharge Vitals Blood pressure 116/46, pulse 60, temperature 97.3 F (36.3 C), temperature source Oral, resp. rate 18, height 5\' 10"  (1.778 m), weight 189 lb 11.2 oz (86.047 kg), SpO2 100 %.  Filed Weights   09/13/14 0457 09/14/14 0743 09/15/14 0613  Weight: 195 lb 9.6 oz (88.724 kg) 194 lb 12.8 oz (88.361 kg) 189 lb 11.2 oz (86.047 kg)    Labs  Basic Metabolic Panel  Recent Labs  09/13/14 0910  09/14/14 0509 09/15/14 0334  NA 136  < > 136 137  K 5.1  < > 5.1 4.2  CL 97*  < > 95* 98*  CO2 31  < > 34* 30  GLUCOSE 109*  < > 95 83  BUN 18  < > 20 25*  CREATININE 1.14  < > 1.27* 1.22  CALCIUM 9.0  < > 9.4 9.1  MG 1.9  --   --   --   < > = values in this interval not displayed.  Disposition  Pt is being discharged home today in good condition.  Follow-up Plans & Appointments      Follow-up Information    Follow up with Richardson Dopp, PA-C On 09/22/2014.   Specialties:  Physician Assistant, Radiology, Interventional Cardiology   Why:  2:40pm. Please show up 30 min early to obtain Basic Metabolic Panel   Contact information:   [address] [address] [phone]       Follow up with  Virl Axe, MD On 10/28/2014.   Specialty:  Cardiology   Why:  2:00pm   Contact information:   [address] [place] [address] [phone]       Discharge Medications    Medication List    STOP taking these medications        lisinopril-hydrochlorothiazide 20-25 MG per tablet  Commonly known as:  PRINZIDE,ZESTORETIC      TAKE these medications        aspirin 81 MG tablet  Take 81 mg by mouth daily.     atorvastatin 40 MG tablet  Commonly known as:  LIPITOR  Take 1 tablet (40 mg total) by mouth daily.     furosemide 20 MG tablet  Commonly known as:  LASIX  Take 3 tablets (60 mg total) by mouth daily.     labetalol 100 MG tablet  Commonly known as:  NORMODYNE  Take 1 tablet (100 mg total) by mouth 2 (two) times daily.     lisinopril 20 MG tablet  Commonly known as:  PRINIVIL,ZESTRIL  Take 1 tablet (20 mg total) by mouth daily.     potassium chloride SA 20 MEQ tablet  Commonly known as:  K-DUR,KLOR-CON  Take 3 tablets (60 mEq total) by mouth daily.     timolol 0.5 % ophthalmic solution  Commonly known as:  Greenback  1 drop into both eyes daily.        Outstanding Labs/Studies  BMET on 09/22/2014  Duration of Discharge Encounter   Greater than 30 minutes including physician time.  Hilbert Corrigan PA-C Pager: [phone] 09/15/2014, 11:49 AM

## 2014-09-15 NOTE — Progress Notes (Signed)
Patient Name: [name] Date of Encounter: 09/15/2014  Primary Cardiologist (EP): Dr. Caryl Comes    Patient Profile: 81 y.o. male with a history of high-grade AV block s/p medtronic dual chamber pacemaker, HTN, HLD, femoral DVT and BPH who presented with worsening dyspnea.  Principal Problem:   Acute on chronic diastolic congestive heart failure Active Problems:   Hypertension   Heart failure   Hypokalemia    SUBJECTIVE  Denies any CP or SOB. Feeling good.   CURRENT MEDS . aspirin  81 mg Oral Daily  . atorvastatin  40 mg Oral q1800  . furosemide  60 mg Oral Daily  . heparin  5,000 Units Subcutaneous 3 times per day  . labetalol  100 mg Oral BID  . lisinopril  20 mg Oral Daily  . metolazone  2.5 mg Oral Once per day on Mon Wed Fri  . potassium chloride  60 mEq Oral Daily  . sodium chloride  3 mL Intravenous Q12H  . timolol  1 drop Both Eyes Daily    OBJECTIVE  Filed Vitals:   09/14/14 0952 09/14/14 1507 09/14/14 2056 09/15/14 0613  BP: 102/47 122/47 120/50 128/57  Pulse: 66 68 72 76  Temp:  97.9 F (36.6 C) 97.8 F (36.6 C) 97.3 F (36.3 C)  TempSrc:  Oral Oral Oral  Resp:  18 18 18   Height:      Weight:    189 lb 11.2 oz (86.047 kg)  SpO2: 99% 100% 100% 100%    Intake/Output Summary (Last 24 hours) at 09/15/14 0944 Last data filed at 09/15/14 0858  Gross per 24 hour  Intake    960 ml  Output   1675 ml  Net   -715 ml   Filed Weights   09/13/14 0457 09/14/14 0743 09/15/14 0613  Weight: 195 lb 9.6 oz (88.724 kg) 194 lb 12.8 oz (88.361 kg) 189 lb 11.2 oz (86.047 kg)    PHYSICAL EXAM  General: Pleasant, NAD. Neuro: Alert and oriented X 3. Moves all extremities spontaneously. Psych: Normal affect. HEENT:  Normal  Neck: Supple without bruits or JVD. Lungs:  Resp regular and unlabored, CTA. Heart: RRR no s3, s4, or murmurs. Abdomen: Soft, non-tender, non-distended, BS + x 4.  Extremities: No clubbing, cyanosis. DP/PT/Radials 2+ and equal  bilaterally. 1+ pitting edema  Accessory Clinical Findings  Basic Metabolic Panel  Recent Labs  09/13/14 0910  09/14/14 0509 09/15/14 0334  NA 136  < > 136 137  K 5.1  < > 5.1 4.2  CL 97*  < > 95* 98*  CO2 31  < > 34* 30  GLUCOSE 109*  < > 95 83  BUN 18  < > 20 25*  CREATININE 1.14  < > 1.27* 1.22  CALCIUM 9.0  < > 9.4 9.1  MG 1.9  --   --   --   < > = values in this interval not displayed.  TELE AV pacing rhythm with frequent PVCs     ECG  No new EKG  Echocardiogram 09/08/2014  LV EF: 45% -  50%  ------------------------------------------------------------------- Indications:   CHF - 428.0.  ------------------------------------------------------------------- History:  PMH:  Atrial fibrillation. Risk factors: Hypertension. Dyslipidemia.  ------------------------------------------------------------------- Study Conclusions  - Left ventricle: The cavity size was normal. There was mild focal basal and mild concentric hypertrophy of the septum. Systolic function was mildly reduced. The estimated ejection fraction was in the range of 45% to 50%. Diffuse hypokinesis. Features are consistent with a pseudonormal left  ventricular filling pattern, with concomitant abnormal relaxation and increased filling pressure (grade 2 diastolic dysfunction). Doppler parameters are consistent with elevated ventricular end-diastolic filling pressure. - Aortic valve: Trileaflet; mildly thickened, mildly calcified leaflets. Transvalvular velocity was minimally increased. There was no stenosis. There was no regurgitation. - Mitral valve: Mildly thickened, moderately calcified leaflets . There was moderate regurgitation. - Left atrium: The atrium was normal in size. - Right ventricle: Systolic function was normal. - Right atrium: Pacer wire or catheter noted in right atrium. - Tricuspid valve: There was moderate regurgitation. - Pulmonary arteries:  Systolic pressure was moderately increased. PA peak pressure: 51 mm Hg (S).  Impressions:  - LVEF appears midly impaired, the estimate might be affected by poorly visualized endocardium and frequent PVCs. Filling pressures are elevated. Moderate mitral and tricuspid regurgitation. There is moderate pulmonary hypertension.    Radiology/Studies  Dg Chest 2 View  09/07/2014   CLINICAL DATA:  Shortness of breath.  EXAM: CHEST  2 VIEW  COMPARISON:  September 29, 2012.  FINDINGS: The heart size and mediastinal contours are within normal limits. No pneumothorax is noted. Minimal bilateral pleural effusions are noted Left-sided pacemaker is unchanged in position. Stable elevation of right hemidiaphragm is noted. Increased central vascular congestion is noted as well as perihilar and basilar interstitial densities concerning for possible edema. The visualized skeletal structures are unremarkable.  IMPRESSION: Increased central pulmonary vascular congestion with associated perihilar and basilar interstitial densities concerning for edema. Minimal bilateral pleural effusions are noted.   Electronically Signed   By: Marijo Conception, M.D.   On: 09/07/2014 13:21    ASSESSMENT AND PLAN  1. Acute on chronic diastolic HF  - negative 25K I/O, weight down from 207 lbs to 194 lbs.  - surprising significant response to metolazone, had 4L output on 80mg  PO lasix yesterday with 2.5mg  metolazone. Still has 1+ pitting edema in the leg, however Cr increasing, likely venous stasis  - Continue 60mg  daily of PO lasix. Discharge today with home health and rolling walker. Will need BMET in 7 days with TOC followup. Need to monitor Na and fluid intake and weight himself daily  2. New systolic  dysfunction  - EF now 45-50%, down from 55-60% on previous echo 10/2012. - consider NST as outpatient to assess for myocardial ischemia  3. HTN  4. Hypokalemia  5. H/o femoral DVT  Signed, Almyra Deforest  PA-C Pager: [phone] As above, patient seen and examined. His dyspnea has improved. No chest pain. Excellent diuresis since admission. Plan to discharge home today on Lasix 60 mg daily and remaining medications. I have instructed him on low-sodium diet. I have asked him to weigh himself daily and take an additional 40 mg of Lasix for weight gain of 2 pounds. Check potassium and renal function in 1 week. TOC appointment in one week and see Dr. Caryl Comes in 4-6 weeks. Will consider outpatient functional study following discharge. >30 min PA and physician time D2 Kirk Ruths

## 2014-09-15 NOTE — Discharge Instructions (Signed)
Please avoid salty food, limit daily fluid intake to <2 L per day Weigh yourself every morning, please take additional 40mg  lasix if weight increase by more than 2 lbs overnight or 5 lbs in a single week.

## 2014-09-16 ENCOUNTER — Telehealth: Payer: Self-pay

## 2014-09-16 NOTE — Telephone Encounter (Signed)
Pt is on TCM list. Pt dc instructed to follow up with cardiology. Admitted for CHF and htn.

## 2014-09-17 ENCOUNTER — Other Ambulatory Visit: Payer: Medicare Other

## 2014-09-18 LAB — URINE CULTURE

## 2014-09-22 ENCOUNTER — Ambulatory Visit (INDEPENDENT_AMBULATORY_CARE_PROVIDER_SITE_OTHER): Payer: Medicare Other | Admitting: Physician Assistant

## 2014-09-22 ENCOUNTER — Encounter: Payer: Self-pay | Admitting: Physician Assistant

## 2014-09-22 ENCOUNTER — Other Ambulatory Visit (INDEPENDENT_AMBULATORY_CARE_PROVIDER_SITE_OTHER): Payer: Medicare Other | Admitting: *Deleted

## 2014-09-22 VITALS — BP 115/62 | HR 72 | Ht 70.0 in | Wt 193.8 lb

## 2014-09-22 DIAGNOSIS — I442 Atrioventricular block, complete: Secondary | ICD-10-CM | POA: Diagnosis not present

## 2014-09-22 DIAGNOSIS — E785 Hyperlipidemia, unspecified: Secondary | ICD-10-CM

## 2014-09-22 DIAGNOSIS — I5042 Chronic combined systolic (congestive) and diastolic (congestive) heart failure: Secondary | ICD-10-CM | POA: Diagnosis not present

## 2014-09-22 DIAGNOSIS — Z950 Presence of cardiac pacemaker: Secondary | ICD-10-CM

## 2014-09-22 DIAGNOSIS — I5033 Acute on chronic diastolic (congestive) heart failure: Secondary | ICD-10-CM | POA: Diagnosis not present

## 2014-09-22 DIAGNOSIS — I429 Cardiomyopathy, unspecified: Secondary | ICD-10-CM

## 2014-09-22 DIAGNOSIS — I10 Essential (primary) hypertension: Secondary | ICD-10-CM

## 2014-09-22 LAB — BASIC METABOLIC PANEL
BUN: 18 mg/dL (ref 6–23)
CALCIUM: 9 mg/dL (ref 8.4–10.5)
CO2: 25 mEq/L (ref 19–32)
Chloride: 104 mEq/L (ref 96–112)
Creatinine, Ser: 0.99 mg/dL (ref 0.40–1.50)
GFR: 91.96 mL/min (ref >60.00)
Glucose, Bld: 98 mg/dL (ref 70–99)
Potassium: 3.9 mEq/L (ref 3.5–5.1)
Sodium: 135 mEq/L (ref 135–145)

## 2014-09-22 NOTE — Progress Notes (Signed)
Cardiology Office Note   Date:  09/22/2014   ID:  [name], DOB 04/28/1925, MRN [id number]  PCP:  Cathlean Cower, MD  Cardiologist:  Dr. Virl Axe     Chief Complaint  Patient presents with  . Congestive Heart Failure     History of Present Illness: [name] is a 81 y.o. male with a hx of CHB s/p dual chamber pacemaker, HTN, HL, prior DVT, BPH.  He saw Dr. Virl Axe in 06/2014.  Amlodipine was DCd due to worsening edema and Labetalol was added.  His AV delay was shortened.    Admitted 6/28-7/6 with acute on chronic systolic and diastolic CHF.  IV diuresis was augmented with Metolazone. DC weight 194 lbs.  Echo demonstrated EF 45-50% with moderate diastolic dysfunction, moderate TR, PASP 51 mmHg, moderate MR.  Records from his admit indicate that our team recommended consideration of an OP nuclear stress test to assess for ischemia.  Device was interrogated and his device was functioning normally.  Rate detection limits were reset.    Since DC from the hospital, he has been stable. He denies chest discomfort. Breathing is unchanged. He is NYHA 2b. He denies orthopnea or PND. LE edema is unchanged since discharge. He denies syncope. He denies cough or wheezing. He is unsure whether or not home health therapy has been arranged for him. Does not have a scale.   Studies/Reports Reviewed Today:  Echo 09/08/14 - Mild focal basal and mild concentric hypertrophy of the septum. Systolicfunction was mildly reduced. EF 45% to 50%. Diffuse hypokinesis.Grade 2 diastolic dysfunction - Aortic valve: Trileaflet; mildly thickened, mildly calcified leaflets. Transvalvular velocity was minimally increased. Therewas no stenosis. There was no regurgitation. - Mitral valve: Mildly thickened, moderately calcified leaflets .There was moderate regurgitation. - Left atrium: The atrium was normal in size. - Right ventricle: Systolic function was normal. - Right atrium: Pacer wire or catheter  noted in right atrium. - Tricuspid valve: There was moderate regurgitation. - Pulmonary arteries: Systolic pressure was moderately increased.PA peak pressure: 51 mm Hg (S). Impressions:- LVEF appears midly impaired, the estimate might be affected bypoorly visualized endocardium and frequent PVCs.Filling pressures are elevated.Moderate mitral and tricuspid regurgitation.There is moderate pulmonary hypertension.  Myoview 09/2012 IMPRESSION: 1. Diaphragmatic attenuation of the inferior wall. 2. No findings for myocardial ischemia. 3. Estimated ejection fraction 57%.   Past Medical History  Diagnosis Date  . Atrioventricular block, complete     intermittent  . Pacemaker     medtronic dual chamber  . Hepatotoxicity   . Lipoma     pt unaware of this on 09/08/2014  . DVT femoral (deep venous thrombosis) with thrombophlebitis     LUE  . Hyperlipidemia   . Hx of colonic polyps   . Erectile dysfunction   . BPH (benign prostatic hypertrophy)   . HTN (hypertension)   . Chest pain     a. 09/2012 [employer] Cardiolite: EF 57%, diaphragmatic atten of inf wall w/o ischemia.  . Pneumonia 1929  . Arthritis     "it's beginning to come on now" (09/08/2014)    Past Surgical History  Procedure Laterality Date  . Insert / replace / remove pacemaker  04/06/08    Medtronic     Current Outpatient Prescriptions  Medication Sig Dispense Refill  . aspirin 81 MG tablet Take 81 mg by mouth daily.    Marland Kitchen atorvastatin (LIPITOR) 40 MG tablet Take 1 tablet (40 mg total) by mouth daily. 90 tablet 3  . furosemide (  LASIX) 20 MG tablet Take 3 tablets (60 mg total) by mouth daily. 90 tablet 3  . labetalol (NORMODYNE) 100 MG tablet Take 1 tablet (100 mg total) by mouth 2 (two) times daily. 60 tablet 11  . lisinopril (PRINIVIL,ZESTRIL) 20 MG tablet Take 1 tablet (20 mg total) by mouth daily. 30 tablet 5  . potassium chloride SA (K-DUR,KLOR-CON) 20 MEQ tablet Take 3 tablets (60 mEq total) by mouth daily.  90 tablet 3  . timolol (BETIMOL) 0.5 % ophthalmic solution Place 1 drop into both eyes daily.     No current facility-administered medications for this visit.    Allergies:   Review of patient's allergies indicates no known allergies.    Social History:  The patient  reports that he has quit smoking. His smoking use included Cigarettes. He quit after 5 years of use. He has never used smokeless tobacco. He reports that he drinks alcohol.   Family History:  The patient's family history includes Arthritis in an other family member; Heart attack in his paternal uncle and another family member.    ROS:   Please see the history of present illness.   Review of Systems  Constitution: Positive for malaise/fatigue.  Cardiovascular: Positive for dyspnea on exertion and leg swelling.  Neurological: Positive for loss of balance.  All other systems reviewed and are negative.     PHYSICAL EXAM: VS:  BP 115/62 mmHg  Pulse 72  Ht 5\' 10"  (1.778 m)  Wt 193 lb 12.8 oz (87.907 kg)  BMI 27.81 kg/m2    Wt Readings from Last 3 Encounters:  09/22/14 193 lb 12.8 oz (87.907 kg)  09/15/14 189 lb 11.2 oz (86.047 kg)  06/29/14 205 lb 12.8 oz (93.35 kg)     GEN: Well nourished, well developed, in no acute distress HEENT: normal Neck: + JVD, no masses Cardiac:  Normal S1/S2, RRR; no murmur ,  no rubs or gallops, tight 1+ bilateral LE edema   Respiratory:  clear to auscultation bilaterally, no wheezing, rhonchi or rales. GI: soft, nontender, nondistended, + BS MS: no deformity or atrophy Skin: warm and dry  Neuro:  CNs II-XII intact, Strength and sensation are intact Psych: Normal affect   EKG:  EKG is ordered today.  It demonstrates:   Underlying NSR, V paced, HR 72   Recent Labs: 09/07/2014: B Natriuretic Peptide 601.5*; Hemoglobin 12.0*; Platelets 126*; TSH 1.227 09/13/2014: Magnesium 1.9 09/15/2014: BUN 25*; Creatinine, Ser 1.22; Potassium 4.2; Sodium 137    Lipid Panel    Component Value  Date/Time   CHOL 111 09/08/2014 0344   TRIG 51 09/08/2014 0344   HDL 46 09/08/2014 0344   CHOLHDL 2.4 09/08/2014 0344   VLDL 10 09/08/2014 0344   LDLCALC 55 09/08/2014 0344   LDLDIRECT 137.7 03/20/2012 1439      ASSESSMENT AND PLAN:  Chronic combined systolic and diastolic CHF (congestive heart failure):  Volume appears stable since DC.  He does not have a scale.  He is not sure if [facility] has been arranged.  He did get a walker.  I will have my CMA contact [facility] to see what has been set up.  He will need HHRN and HHPT. Will see if they can get a scale to him.  BMET was already drawn today.   Cardiomyopathy:  Etiology not clear.  Question if related to pacing.  There was some thought given to considering OP stress testing after DC.  He is not having CP.  He sees Dr.  Virl Axe next month.  I will defer the decision on whether to pursue stress testing to Dr. Caryl Comes.   Atrioventricular block, complete s/p Cardiac pacemaker in situ:  FU with EP as planned.   Essential hypertension:  Controlled.   HLD (hyperlipidemia):  Continue statin.     Medication Changes: Current medicines are reviewed at length with the patient today.  Concerns regarding medicines are as outlined above.  The following changes have been made:   Discontinued Medications   No medications on file   Modified Medications   No medications on file   New Prescriptions   No medications on file    Labs/ tests ordered today include:   Orders Placed This Encounter  Procedures  . EKG 12-Lead     Disposition:   FU with Dr. Virl Axe as planned.    Signed, Versie Starks, MHS 09/22/2014 3:42 PM    [facility] [address], [place], [place]  [address] Phone: [phone]; Fax: [phone]

## 2014-09-22 NOTE — Patient Instructions (Addendum)
Medication Instructions:  Your physician recommends that you continue on your current medications as directed. Please refer to the Current Medication list given to you today.   Labwork: DONE  Testing/Procedures: NONE  Follow-Up: KEEP YOUR APPT WITH DR. Caryl Comes 10/28/14   Any Other Special Instructions Will Be Listed Below (If Applicable).  WE WILL SEND OVER AN ORDER TO [facility] FOR REFERRAL FOR NURSE AND PT

## 2014-09-28 ENCOUNTER — Telehealth: Payer: Self-pay | Admitting: *Deleted

## 2014-09-28 ENCOUNTER — Encounter: Payer: Medicare Other | Admitting: *Deleted

## 2014-09-28 ENCOUNTER — Telehealth: Payer: Self-pay | Admitting: Cardiology

## 2014-09-28 NOTE — Telephone Encounter (Signed)
[facility] has been unable to contact the patient to admit him to their services. They will try to contact the patient for 2 more days and then if unable to reach they will stop trying. Okay to proceed as stated above faxed

## 2014-09-28 NOTE — Telephone Encounter (Signed)
Spoke with pt and reminded pt of remote transmission that is due today. Pt verbalized understanding.   

## 2014-09-29 ENCOUNTER — Encounter: Payer: Self-pay | Admitting: Cardiology

## 2014-09-30 ENCOUNTER — Ambulatory Visit (INDEPENDENT_AMBULATORY_CARE_PROVIDER_SITE_OTHER): Payer: Medicare Other | Admitting: Internal Medicine

## 2014-09-30 ENCOUNTER — Encounter: Payer: Self-pay | Admitting: Internal Medicine

## 2014-09-30 VITALS — BP 128/66 | HR 98 | Temp 97.4°F | Ht 70.0 in | Wt 197.0 lb

## 2014-09-30 DIAGNOSIS — I5033 Acute on chronic diastolic (congestive) heart failure: Secondary | ICD-10-CM | POA: Diagnosis not present

## 2014-09-30 DIAGNOSIS — Z23 Encounter for immunization: Secondary | ICD-10-CM

## 2014-09-30 DIAGNOSIS — Z0000 Encounter for general adult medical examination without abnormal findings: Secondary | ICD-10-CM

## 2014-09-30 MED ORDER — PNEUMOCOCCAL 13-VAL CONJ VACC IM SUSP
0.5000 mL | Freq: Once | INTRAMUSCULAR | Status: AC
  Administered 2014-09-30: 0.5 mL via INTRAMUSCULAR

## 2014-09-30 NOTE — Assessment & Plan Note (Signed)

## 2014-09-30 NOTE — Progress Notes (Signed)
Pre visit review using our clinic review tool, if applicable. No additional management support is needed unless otherwise documented below in the visit note. 

## 2014-09-30 NOTE — Progress Notes (Signed)
Subjective:    Patient ID: [name], male    DOB: 14-Sep-1925, 81 y.o.   MRN: [id number]  HPI  Here for wellness and f/u recent hospn,  Overall doing ok;  Pt denies Chest pain, worsening SOB, DOE, wheezing, orthopnea, PND, worsening LE edema, palpitations, dizziness or syncope.  Pt denies neurological change such as new headache, facial or extremity weakness.  Pt denies polydipsia, polyuria, or low sugar symptoms. Pt states overall good compliance with treatment and medications, good tolerability, and has been trying to follow appropriate diet.  Pt denies worsening depressive symptoms, suicidal ideation or panic. No fever, night sweats, wt loss, loss of appetite, or other constitutional symptoms.  Pt states good ability with ADL's, has low fall risk, home safety reviewed and adequate, no other significant changes in hearing or vision, walks with cane. Due for prevnar Past Medical History  Diagnosis Date  . Atrioventricular block, complete     intermittent  . Pacemaker     medtronic dual chamber  . Hepatotoxicity   . Lipoma     pt unaware of this on 09/08/2014  . DVT femoral (deep venous thrombosis) with thrombophlebitis     LUE  . Hyperlipidemia   . Hx of colonic polyps   . Erectile dysfunction   . BPH (benign prostatic hypertrophy)   . HTN (hypertension)   . Chest pain     a. 09/2012 Lexican Cardiolite: EF 57%, diaphragmatic atten of inf wall w/o ischemia.  . Pneumonia 1929  . Arthritis     "it's beginning to come on now" (09/08/2014)   Past Surgical History  Procedure Laterality Date  . Insert / replace / remove pacemaker  04/06/08    Medtronic    reports that he has quit smoking. His smoking use included Cigarettes. He quit after 5 years of use. He has never used smokeless tobacco. He reports that he drinks alcohol. His drug history is not on file. family history includes Arthritis in an other family member; Heart attack in his paternal uncle and another family member. No  Known Allergies Current Outpatient Prescriptions on File Prior to Visit  Medication Sig Dispense Refill  . aspirin 81 MG tablet Take 81 mg by mouth daily.    Marland Kitchen atorvastatin (LIPITOR) 40 MG tablet Take 1 tablet (40 mg total) by mouth daily. 90 tablet 3  . furosemide (LASIX) 20 MG tablet Take 3 tablets (60 mg total) by mouth daily. 90 tablet 3  . labetalol (NORMODYNE) 100 MG tablet Take 1 tablet (100 mg total) by mouth 2 (two) times daily. 60 tablet 11  . lisinopril (PRINIVIL,ZESTRIL) 20 MG tablet Take 1 tablet (20 mg total) by mouth daily. 30 tablet 5  . potassium chloride SA (K-DUR,KLOR-CON) 20 MEQ tablet Take 3 tablets (60 mEq total) by mouth daily. 90 tablet 3  . timolol (BETIMOL) 0.5 % ophthalmic solution Place 1 drop into both eyes daily.     No current facility-administered medications on file prior to visit.   Review of Systems Constitutional: Negative for increased diaphoresis, other activity, appetite or siginficant weight change other than noted HENT: Negative for worsening hearing loss, ear pain, facial swelling, mouth sores and neck stiffness.   Eyes: Negative for other worsening pain, redness or visual disturbance.  Respiratory: Negative for shortness of breath and wheezing  Cardiovascular: Negative for chest pain and palpitations.  Gastrointestinal: Negative for diarrhea, blood in stool, abdominal distention or other pain Genitourinary: Negative for hematuria, flank pain or change in urine volume.  Musculoskeletal: Negative for myalgias or other joint complaints.  Skin: Negative for color change and wound or drainage.  Neurological: Negative for syncope and numbness. other than noted Hematological: Negative for adenopathy. or other swelling Psychiatric/Behavioral: Negative for hallucinations, SI, self-injury, decreased concentration or other worsening agitation.      Objective:   Physical Exam BP 128/66 mmHg  Pulse 98  Temp(Src) 97.4 F (36.3 C) (Oral)  Ht 5\' 10"  (1.778  m)  Wt 197 lb (89.359 kg)  BMI 28.27 kg/m2  SpO2 99% VS noted,  Constitutional: Pt is oriented to person, place, and time. Appears well-developed and well-nourished, in no significant distress Head: Normocephalic and atraumatic.  Right Ear: External ear normal.  Left Ear: External ear normal.  Nose: Nose normal.  Mouth/Throat: Oropharynx is clear and moist.  Eyes: Conjunctivae and EOM are normal. Pupils are equal, round, and reactive to light.  Neck: Normal range of motion. Neck supple. No JVD present. No tracheal deviation present or significant neck LA or mass Cardiovascular: Normal rate, regular rhythm, normal heart sounds and intact distal pulses.   Pulmonary/Chest: Effort normal and breath sounds without rales or wheezing  Abdominal: Soft. Bowel sounds are normal. NT. No HSM  Musculoskeletal: Normal range of motion. Exhibits trace pedal bilat edema.  Lymphadenopathy:  Has no cervical adenopathy.  Neurological: Pt is alert and oriented to person, place, and time. Pt has normal reflexes. No cranial nerve deficit. Motor grossly intact Skin: Skin is warm and dry. No rash noted.  Psychiatric:  Has normal mood and affect. Behavior is normal.      Assessment & Plan:

## 2014-09-30 NOTE — Assessment & Plan Note (Signed)
Will also ask for [facility] to be involved as well as [facility] nurse he has seen today

## 2014-09-30 NOTE — Addendum Note (Signed)
Addended by: Lyman Bishop on: 09/30/2014 05:36 PM   Modules accepted: Orders

## 2014-09-30 NOTE — Patient Instructions (Signed)
You had the new Prevnar pneumonia shot today  Please continue all other medications as before, and refills have been done if requested.  Please have the pharmacy call with any other refills you may need.  Please continue your efforts at being more active, low cholesterol diet, and weight control.  You are otherwise up to date with prevention measures today.  Please keep your appointments with your specialists as you may have planned  We can hold on further lab work today  Please return in 6 months, or sooner if needed

## 2014-10-04 ENCOUNTER — Telehealth: Payer: Self-pay | Admitting: Internal Medicine

## 2014-10-04 NOTE — Telephone Encounter (Signed)
Charlynn Grimes is advising that the patient has not had a BM in three days. She recommends a stool softener or script to help with regularity. The patient states that he forgot to bring this up when he was in the office.

## 2014-10-04 NOTE — Telephone Encounter (Signed)
Ok to use stool softener. Start with colace. If no relief may try senna.

## 2014-10-05 NOTE — Telephone Encounter (Signed)
Left message for [name] advising of dr Judi Cong note

## 2014-10-12 ENCOUNTER — Ambulatory Visit: Payer: Medicare Other | Admitting: *Deleted

## 2014-10-12 DIAGNOSIS — I442 Atrioventricular block, complete: Secondary | ICD-10-CM

## 2014-10-13 NOTE — Progress Notes (Signed)
Remote pacemaker transmission.   

## 2014-10-22 ENCOUNTER — Telehealth: Payer: Self-pay | Admitting: Internal Medicine

## 2014-10-22 NOTE — Telephone Encounter (Signed)
Pt c/o swelling: STAT is pt has developed SOB within 24 hours  1. How long have you been experiencing swelling? 48 hours in his hand   2. Where is the swelling located? Left hand, arms, feet and ankles  3.  Are you currently taking a "fluid pill"?yes   4.  Are you currently SOB? No. But he has had it over the past few days.   5.  Have you traveled recently? No   Comments: [facility] nurse called states that the pt has left hand and arm swelling that wasn't present before. 2 plus pitting edema to his feet, she reports that this was not present before. She states that his Lungs are clear however he complains of sluggishness and increase SOB.

## 2014-10-22 NOTE — Progress Notes (Signed)
Has appt 10/28/14.

## 2014-10-22 NOTE — Telephone Encounter (Signed)
HHRN is going to contact [facility] ordering provider first about this matter. She will call back if cardiology is needed for advisement.

## 2014-10-22 NOTE — Telephone Encounter (Signed)
[name] [phone] [facility]   She wanted to report this.   She did call pt cardiologist 1st and they told her to call pcp Left lower arm and hand  was swollen..  Increased shortness of breath.  This is all new things since tues the 8/9.

## 2014-10-25 NOTE — Telephone Encounter (Signed)
She needs to be seen at urgent care or the ED as based on these symptoms cannot exclude a blood clot.

## 2014-10-25 NOTE — Telephone Encounter (Signed)
i called donnita/home care nurse, she is not sure if patient's problem has resolved, i called and left message for patient advising patient of greg calones note

## 2014-10-26 ENCOUNTER — Telehealth: Payer: Self-pay | Admitting: Internal Medicine

## 2014-10-26 NOTE — Telephone Encounter (Signed)
Donnita with [facility] called and Pt has not taken any of his medication yet today and his bp 102/48 and pulse is 58 She has told patient not to take any more of his meds until he hears back from her Can you please call her at [phone]

## 2014-10-26 NOTE — Telephone Encounter (Signed)
New message    Nurse states: Pt B/p today was 102/48 and  pulse was 58 Pt had not taken any of his medications this morning (Lasix, lisinopril and labetalol) and nurse instructed him not to take until he heard from our office and her Nurse did call PCP and they instructed her to call our office Please call to discuss

## 2014-10-26 NOTE — Telephone Encounter (Signed)
Ok to d/c services for now, thanks

## 2014-10-26 NOTE — Telephone Encounter (Signed)
Left message to call back on patient's only phone number listed.

## 2014-10-26 NOTE — Telephone Encounter (Signed)
Please advise. [facility] contacted Cardiology and was advised to call PCP.  HHRN also states that she was referred because of a wound on pt's sacrum but it is almost healed. Does PCP want HHRN to continue visit for swelling and low BP or should pt be discharged?

## 2014-10-27 NOTE — Telephone Encounter (Signed)
HHRN informed 

## 2014-10-28 ENCOUNTER — Encounter: Payer: Medicare Other | Admitting: Internal Medicine

## 2014-10-28 NOTE — Telephone Encounter (Signed)
lmtcb to follow up on below report  (he was a no show to clinic appt today)

## 2014-10-29 NOTE — Telephone Encounter (Signed)
Donita with [facility] tells me patient is doing better today and BPs normalized.  She also encouraged him to obtain BP cuff so that he may take BP daily.  Advised her to contact PCP for BP questions, as Dr. Caryl Comes is not general cardiology and does not follow BPs.  She agreed to contact them.  I did ask her to call back if there is an issue with this.

## 2014-10-29 NOTE — Telephone Encounter (Signed)
[name], nurse with [facility], pt only took asa today and BP was 128/68, also she is seeing him for wound care and wound has healed-do we still want her to continue to see pt due to problem with BP? Pls advise [phone]

## 2014-11-03 ENCOUNTER — Ambulatory Visit: Payer: Medicare Other | Admitting: Internal Medicine

## 2014-11-03 DIAGNOSIS — Z0289 Encounter for other administrative examinations: Secondary | ICD-10-CM

## 2014-11-19 ENCOUNTER — Telehealth: Payer: Self-pay | Admitting: Internal Medicine

## 2014-11-19 NOTE — Telephone Encounter (Signed)
New message     Paperwork was faxed on August 21st for home health certification and plan of care Calling to get status update on paperwork

## 2014-11-22 NOTE — Telephone Encounter (Signed)
Anderson Malta with [facility] is going to fax [facility] orders for Dr. Caryl Comes to sign.  (was ordered by Richardson Dopp, PA-C in July)

## 2014-12-28 ENCOUNTER — Ambulatory Visit (INDEPENDENT_AMBULATORY_CARE_PROVIDER_SITE_OTHER): Payer: Medicare Other

## 2014-12-28 DIAGNOSIS — Z23 Encounter for immunization: Secondary | ICD-10-CM

## 2015-02-15 IMAGING — CR DG CHEST 2V
2 series · 2 of 2 positions shown · non-contrast
Comparison: None.

CLINICAL DATA: Chest pain and tightness.  Hypertension.  Nonsmoker.

CHEST - 2 VIEW

[w chest lat]
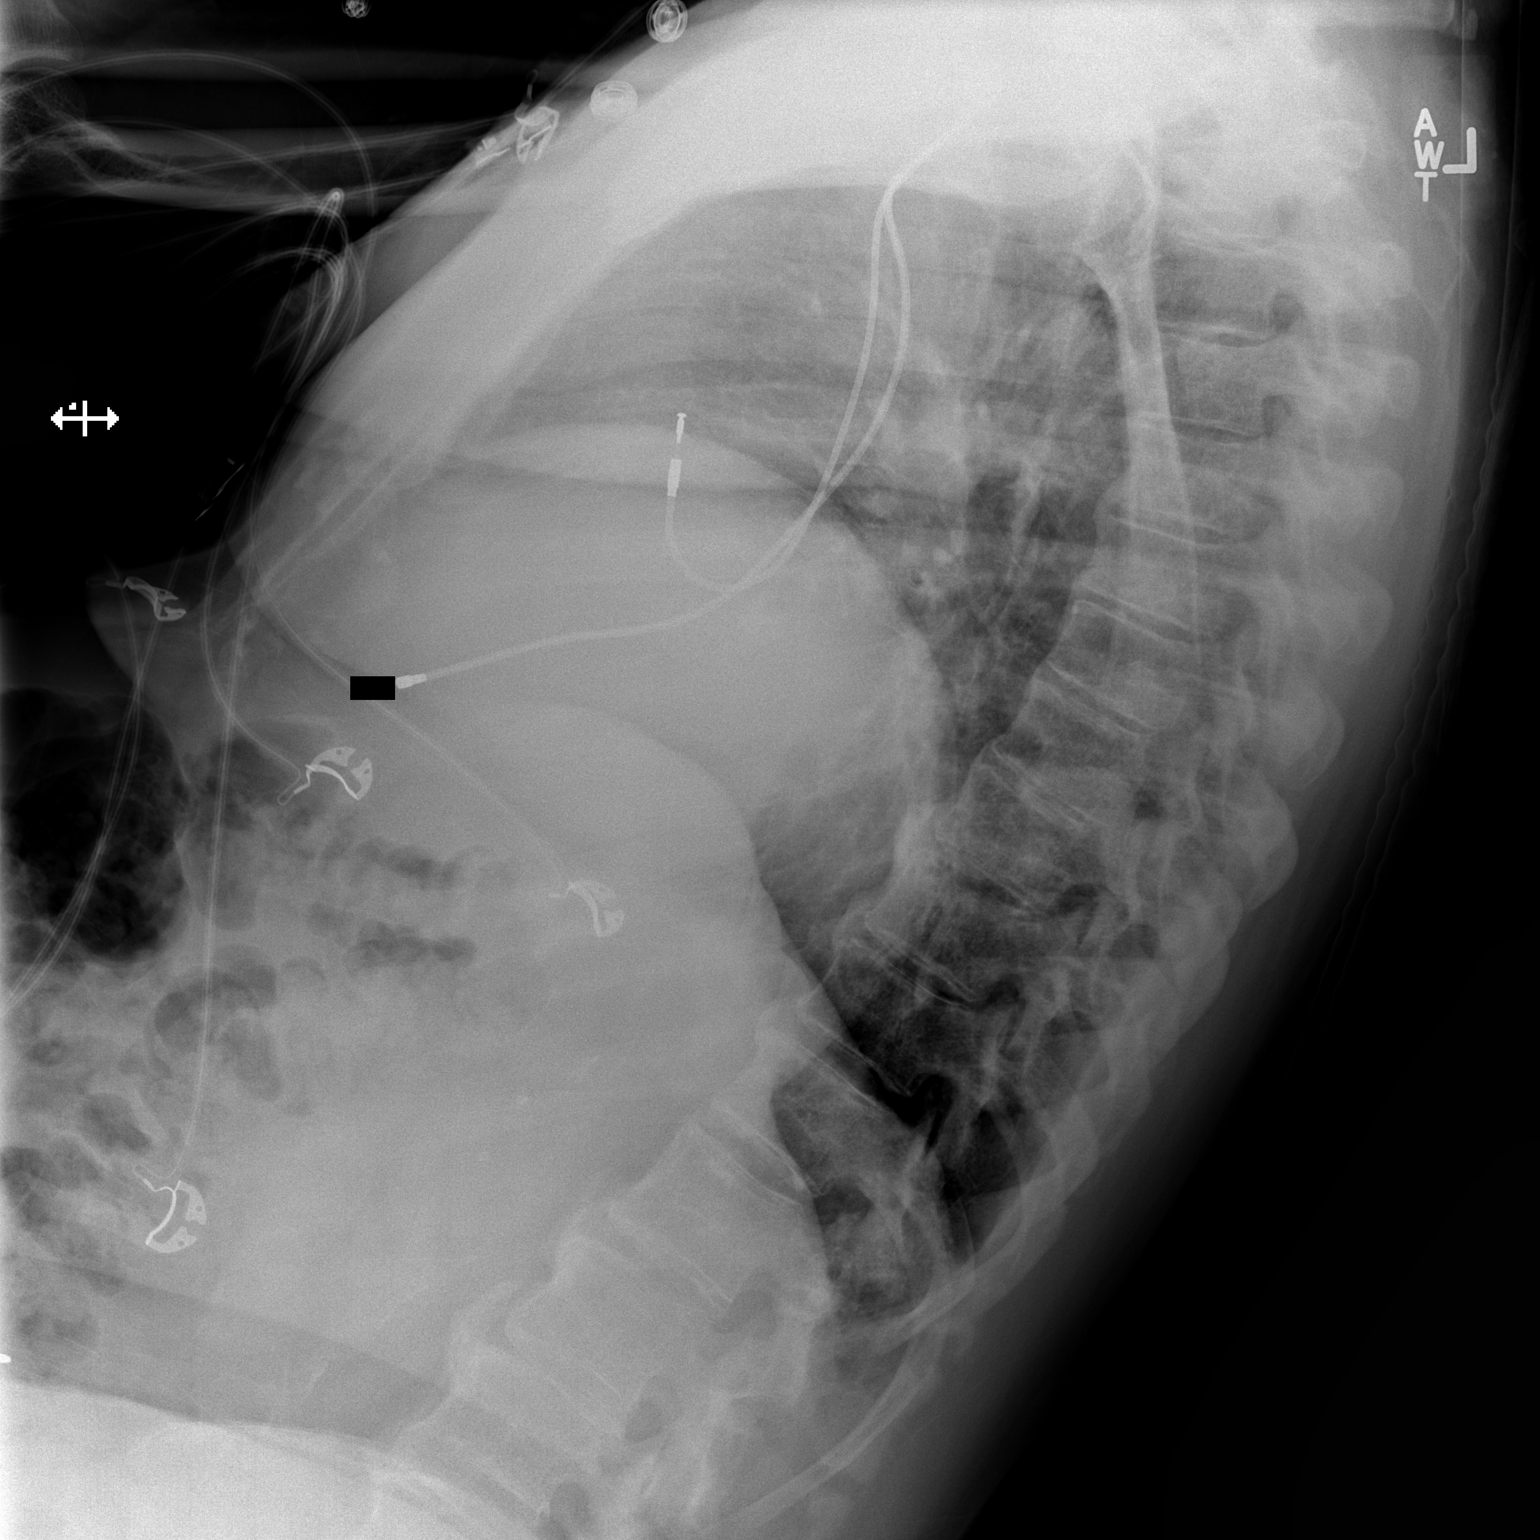

[x chest ap]
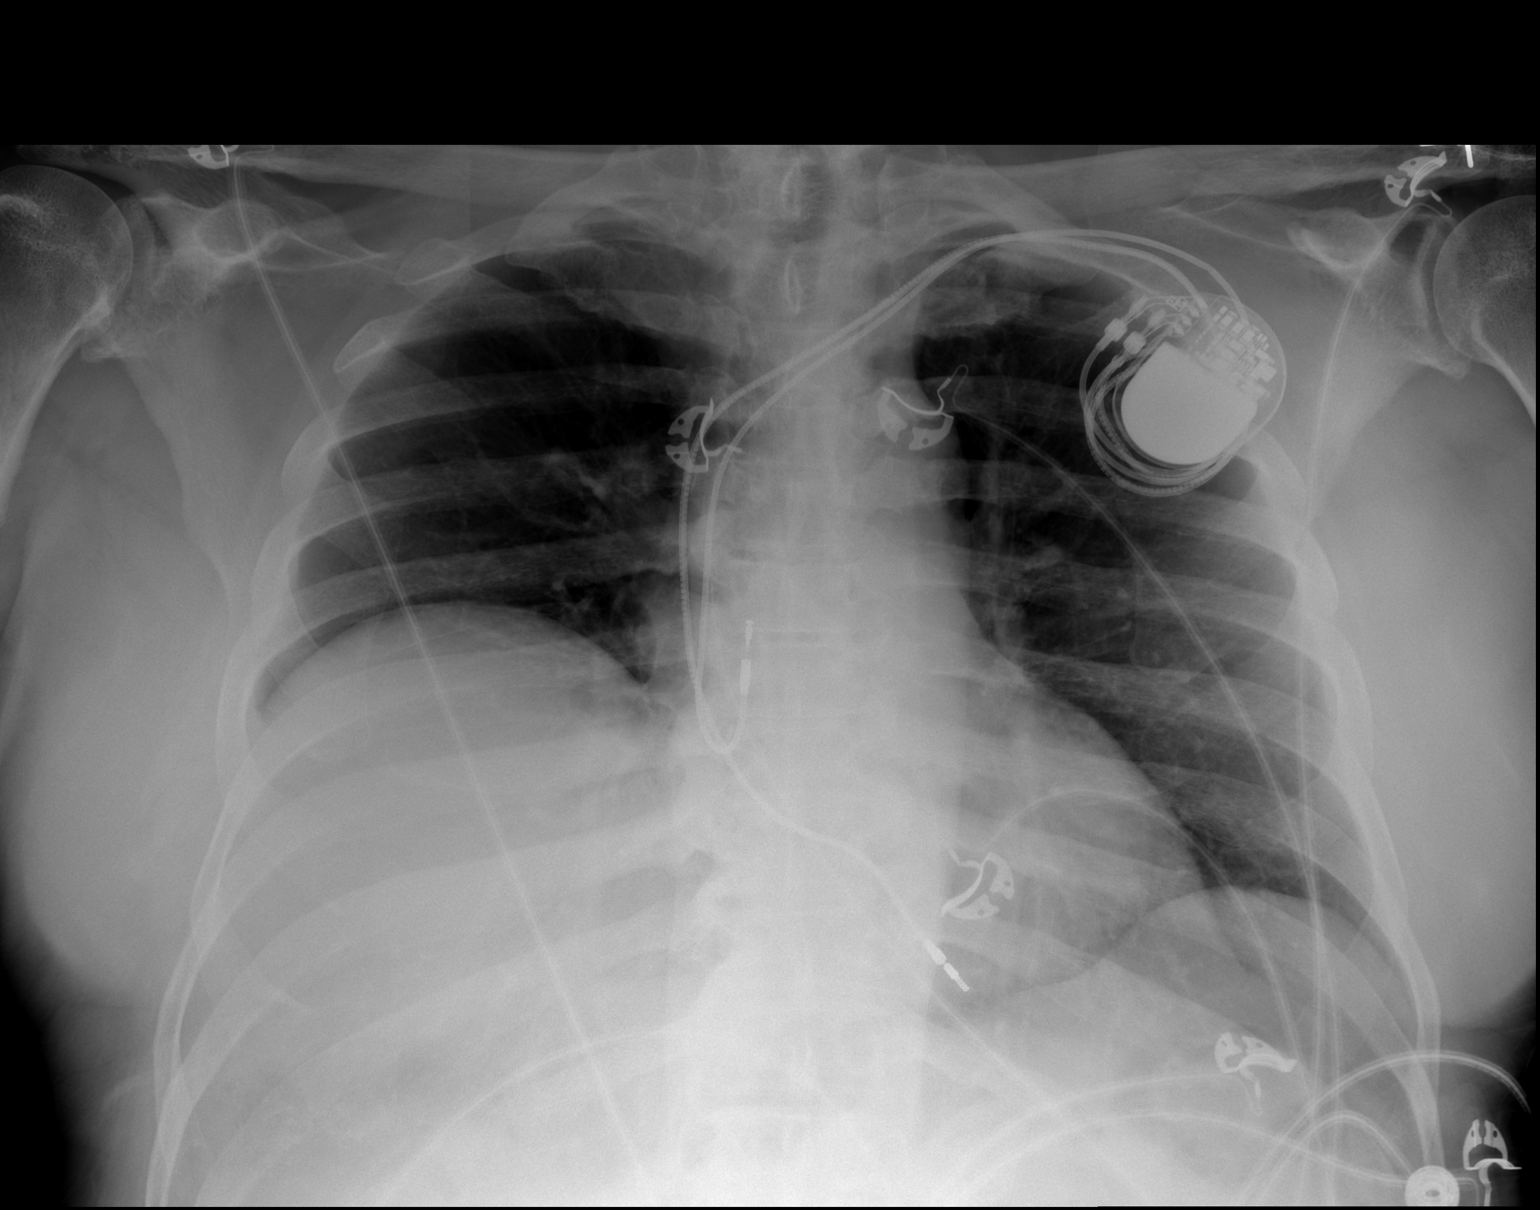

[2 of 2 positions shown; findings below may reference images not displayed]

FINDINGS: There is elevation of the right hemidiaphragm.  Heart
size is probably normal.  The patient has a left-sided transvenous
pacemaker with leads to the right atrium and right ventricle.
Lungs are clear.
IMPRESSION: No evidence for acute cardiopulmonary  abnormality.
Elevated right hemidiaphragm.

## 2015-04-05 ENCOUNTER — Encounter: Payer: Self-pay | Admitting: *Deleted

## 2015-04-20 ENCOUNTER — Ambulatory Visit (INDEPENDENT_AMBULATORY_CARE_PROVIDER_SITE_OTHER): Payer: Medicare Other | Admitting: *Deleted

## 2015-04-20 DIAGNOSIS — I442 Atrioventricular block, complete: Secondary | ICD-10-CM

## 2015-04-22 NOTE — Progress Notes (Signed)
Remote pacemaker transmission.   

## 2015-05-15 ENCOUNTER — Encounter: Payer: Self-pay | Admitting: Cardiology

## 2015-05-15 LAB — CUP PACEART REMOTE DEVICE CHECK
Battery Impedance: 2121 Ohm
Battery Remaining Longevity: 29 mo
Battery Voltage: 2.74 V
Brady Statistic AP VP Percent: 18 %
Brady Statistic AP VS Percent: 0 %
Brady Statistic AS VP Percent: 69 %
Brady Statistic AS VS Percent: 13 %
Date Time Interrogation Session: 20170208204414
Implantable Lead Implant Date: 20010725
Implantable Lead Implant Date: 20010725
Implantable Lead Location: 753859
Implantable Lead Location: 753860
Implantable Lead Model: 6940
Lead Channel Impedance Value: 413 Ohm
Lead Channel Impedance Value: 864 Ohm
Lead Channel Setting Pacing Amplitude: 2 V
Lead Channel Setting Pacing Amplitude: 3 V
Lead Channel Setting Pacing Pulse Width: 0.4 ms
Lead Channel Setting Sensing Sensitivity: 5.6 mV

## 2015-05-15 NOTE — Progress Notes (Signed)
Normal remote reviewed. AHR and VHR episodes are AT with 1:1 conduction  Next follow up 06/2015 in clinic

## 2015-06-11 DEATH — deceased
